# Patient Record
Sex: Female | Born: 1970 | Hispanic: No | Marital: Single | Smoking: Former smoker
Health system: Southern US, Community
[De-identification: ages and names within clinical notes are randomized; demographics above are authoritative.]

## PROBLEM LIST (undated history)

## (undated) DIAGNOSIS — D649 Anemia, unspecified: Secondary | ICD-10-CM

## (undated) DIAGNOSIS — F329 Major depressive disorder, single episode, unspecified: Secondary | ICD-10-CM

## (undated) DIAGNOSIS — F419 Anxiety disorder, unspecified: Secondary | ICD-10-CM

## (undated) DIAGNOSIS — F32A Depression, unspecified: Secondary | ICD-10-CM

## (undated) HISTORY — PX: OTHER SURGICAL HISTORY: SHX169

## (undated) HISTORY — PX: APPENDECTOMY: SHX54

---

## 1998-03-02 ENCOUNTER — Emergency Department (HOSPITAL_COMMUNITY): Admission: EM | Admit: 1998-03-02 | Discharge: 1998-03-02 | Payer: Self-pay | Admitting: Family Medicine

## 1999-02-03 ENCOUNTER — Ambulatory Visit (HOSPITAL_COMMUNITY): Admission: RE | Admit: 1999-02-03 | Discharge: 1999-02-03 | Payer: Self-pay | Admitting: Obstetrics

## 1999-03-07 ENCOUNTER — Ambulatory Visit (HOSPITAL_COMMUNITY): Admission: RE | Admit: 1999-03-07 | Discharge: 1999-03-07 | Payer: Self-pay | Admitting: *Deleted

## 1999-04-27 ENCOUNTER — Inpatient Hospital Stay (HOSPITAL_COMMUNITY): Admission: AD | Admit: 1999-04-27 | Discharge: 1999-04-27 | Payer: Self-pay | Admitting: *Deleted

## 1999-05-20 ENCOUNTER — Ambulatory Visit (HOSPITAL_COMMUNITY): Admission: RE | Admit: 1999-05-20 | Discharge: 1999-05-20 | Payer: Self-pay | Admitting: *Deleted

## 1999-05-26 ENCOUNTER — Inpatient Hospital Stay (HOSPITAL_COMMUNITY): Admission: AD | Admit: 1999-05-26 | Discharge: 1999-05-26 | Payer: Self-pay | Admitting: *Deleted

## 1999-06-03 ENCOUNTER — Inpatient Hospital Stay (HOSPITAL_COMMUNITY): Admission: AD | Admit: 1999-06-03 | Discharge: 1999-06-03 | Payer: Self-pay | Admitting: Obstetrics & Gynecology

## 1999-06-14 ENCOUNTER — Inpatient Hospital Stay (HOSPITAL_COMMUNITY): Admission: AD | Admit: 1999-06-14 | Discharge: 1999-06-14 | Payer: Self-pay | Admitting: *Deleted

## 1999-06-18 ENCOUNTER — Inpatient Hospital Stay (HOSPITAL_COMMUNITY): Admission: AD | Admit: 1999-06-18 | Discharge: 1999-06-18 | Payer: Self-pay | Admitting: Obstetrics & Gynecology

## 1999-06-19 ENCOUNTER — Observation Stay (HOSPITAL_COMMUNITY): Admission: AD | Admit: 1999-06-19 | Discharge: 1999-06-20 | Payer: Self-pay | Admitting: Obstetrics

## 1999-06-23 ENCOUNTER — Inpatient Hospital Stay (HOSPITAL_COMMUNITY): Admission: AD | Admit: 1999-06-23 | Discharge: 1999-06-26 | Payer: Self-pay | Admitting: Obstetrics & Gynecology

## 2000-08-07 ENCOUNTER — Encounter: Admission: RE | Admit: 2000-08-07 | Discharge: 2000-08-07 | Payer: Self-pay | Admitting: Family Medicine

## 2000-08-09 ENCOUNTER — Ambulatory Visit (HOSPITAL_COMMUNITY): Admission: RE | Admit: 2000-08-09 | Discharge: 2000-08-09 | Payer: Self-pay | Admitting: *Deleted

## 2001-07-08 ENCOUNTER — Encounter: Payer: Self-pay | Admitting: Internal Medicine

## 2001-07-08 ENCOUNTER — Encounter: Admission: RE | Admit: 2001-07-08 | Discharge: 2001-07-08 | Payer: Self-pay | Admitting: Internal Medicine

## 2002-02-03 ENCOUNTER — Encounter (INDEPENDENT_AMBULATORY_CARE_PROVIDER_SITE_OTHER): Payer: Self-pay | Admitting: Specialist

## 2002-02-04 ENCOUNTER — Inpatient Hospital Stay (HOSPITAL_COMMUNITY): Admission: AD | Admit: 2002-02-04 | Discharge: 2002-02-05 | Payer: Self-pay | Admitting: *Deleted

## 2005-03-13 ENCOUNTER — Ambulatory Visit: Payer: Self-pay | Admitting: Family Medicine

## 2005-03-13 ENCOUNTER — Other Ambulatory Visit: Admission: RE | Admit: 2005-03-13 | Discharge: 2005-03-13 | Payer: Self-pay | Admitting: Family Medicine

## 2005-03-13 ENCOUNTER — Encounter (INDEPENDENT_AMBULATORY_CARE_PROVIDER_SITE_OTHER): Payer: Self-pay | Admitting: Specialist

## 2005-03-14 ENCOUNTER — Other Ambulatory Visit: Admission: RE | Admit: 2005-03-14 | Discharge: 2005-03-14 | Payer: Self-pay | Admitting: Family Medicine

## 2007-05-16 ENCOUNTER — Telehealth (INDEPENDENT_AMBULATORY_CARE_PROVIDER_SITE_OTHER): Payer: Self-pay | Admitting: *Deleted

## 2007-05-20 ENCOUNTER — Encounter (INDEPENDENT_AMBULATORY_CARE_PROVIDER_SITE_OTHER): Payer: Self-pay | Admitting: Family Medicine

## 2007-05-20 ENCOUNTER — Other Ambulatory Visit: Admission: RE | Admit: 2007-05-20 | Discharge: 2007-05-20 | Payer: Self-pay | Admitting: Family Medicine

## 2007-05-20 ENCOUNTER — Ambulatory Visit: Payer: Self-pay | Admitting: Family Medicine

## 2007-05-20 DIAGNOSIS — N760 Acute vaginitis: Secondary | ICD-10-CM | POA: Insufficient documentation

## 2007-05-22 ENCOUNTER — Telehealth (INDEPENDENT_AMBULATORY_CARE_PROVIDER_SITE_OTHER): Payer: Self-pay | Admitting: Family Medicine

## 2007-05-24 ENCOUNTER — Encounter (INDEPENDENT_AMBULATORY_CARE_PROVIDER_SITE_OTHER): Payer: Self-pay | Admitting: *Deleted

## 2008-01-09 ENCOUNTER — Telehealth (INDEPENDENT_AMBULATORY_CARE_PROVIDER_SITE_OTHER): Payer: Self-pay | Admitting: *Deleted

## 2008-04-06 ENCOUNTER — Emergency Department (HOSPITAL_COMMUNITY): Admission: EM | Admit: 2008-04-06 | Discharge: 2008-04-06 | Payer: Self-pay | Admitting: Emergency Medicine

## 2008-09-06 ENCOUNTER — Emergency Department (HOSPITAL_COMMUNITY): Admission: EM | Admit: 2008-09-06 | Discharge: 2008-09-06 | Payer: Self-pay | Admitting: Emergency Medicine

## 2010-08-16 LAB — URINALYSIS, ROUTINE W REFLEX MICROSCOPIC
Bilirubin Urine: NEGATIVE
Glucose, UA: NEGATIVE mg/dL
Ketones, ur: NEGATIVE mg/dL
Nitrite: NEGATIVE
Protein, ur: NEGATIVE mg/dL
Specific Gravity, Urine: 1.005 (ref 1.005–1.030)
Urobilinogen, UA: 0.2 mg/dL (ref 0.0–1.0)
pH: 6.5 (ref 5.0–8.0)

## 2010-08-16 LAB — URINE CULTURE

## 2010-08-16 LAB — PREGNANCY, URINE: Preg Test, Ur: NEGATIVE

## 2010-08-16 LAB — URINE MICROSCOPIC-ADD ON

## 2010-09-23 NOTE — Op Note (Signed)
Megan Wall, Megan Wall                       ACCOUNT NO.:  [id number]   MEDICAL RECORD NO.:  [id number]                   PATIENT TYPE:  OIB   LOCATION:  2899                                 FACILITY:  [facility]   PHYSICIAN:  Vikki Ports, M.D.         DATE OF BIRTH:  [birth year]   DATE OF PROCEDURE:  02/03/2002  DATE OF DISCHARGE:                                 OPERATIVE REPORT   PREOPERATIVE DIAGNOSES:  Right thyroid nodule.   POSTOPERATIVE DIAGNOSES:  Right thyroid nodule.  Frozen section benign.   OPERATION PERFORMED:  Right thyroidectomy   SURGEON:  Vikki Ports, M.D.   ASSISTANT:  Rose Phi. Maple Hudson, M.D.   ANESTHESIA:  General.   DESCRIPTION OF PROCEDURE:  The patient was taken to the operating room and  placed in supine position.  After adequate  anesthesia was induced, using  endotracheal tube, the neck and upper chest was prepped and draped in the  usual sterile fashion.  Using a transverse low collar incision, I dissected  down through the platysma muscle.  Superior and inferior flaps were created  up to the cricoid notch and inferiorly to the sternal notch.  The strap  muscles were opened down the midline and the left thyroid lobe was digitally  palpated and found to be normal.  There was a very large right thyroid  nodule.  It was separated from overlying muscle and the superior pole was  dissected free with a right clamp in a medial to lateral direction.  The  superior pole was then ligated using a 2-0 silk ligature and a clip.  Then  the middle thyroid vein was identified, clipped and divided.  Dissection  rolling the thyroid nodule anteriorly was accomplished using both sharp and  blunt dissection.  Very careful dissection was taken near the ligament of  Berry until the right recurrent laryngeal nerve was identified and  preserved.  The thyroid was mobilized all the way to the midportion of the  trachea and was transected over straight hemostats.   This was oversewn with  3-0 Vicryl suture.  Adequate hemostasis was ensured.  The recurrent  laryngeal nerve was again inspected.  The frozen section showed the  follicular mostly likely benign process and therefore the strap muscles were  closed with a running 3-0 Vicryl sutures.  Platysma was closed with  interrupted 3-0 Vicryl.  Skin was closed with staples.  Sterile dressing was  applied.  The patient tolerated the procedure well and went to PACU in good  condition.                                                Vikki Ports, M.D.    KR[facility]/MEDQ  D:  02/03/2002  T:  02/03/2002  Job:  [id number]   cc:  Isla Pence, M.D. [facility]

## 2011-02-07 LAB — URINALYSIS, ROUTINE W REFLEX MICROSCOPIC
Bilirubin Urine: NEGATIVE
Ketones, ur: NEGATIVE
Nitrite: NEGATIVE
Specific Gravity, Urine: 1.018
Urobilinogen, UA: 0.2

## 2011-02-07 LAB — PREGNANCY, URINE: Preg Test, Ur: NEGATIVE

## 2011-02-07 LAB — URINE MICROSCOPIC-ADD ON

## 2012-07-25 ENCOUNTER — Emergency Department (HOSPITAL_COMMUNITY)
Admission: EM | Admit: 2012-07-25 | Discharge: 2012-07-26 | Disposition: A | Discharge status: Home / Self Care | Payer: Self-pay | Attending: Emergency Medicine | Admitting: Emergency Medicine

## 2012-07-25 ENCOUNTER — Encounter (HOSPITAL_COMMUNITY): Payer: Self-pay | Admitting: Emergency Medicine

## 2012-07-25 DIAGNOSIS — Z79899 Other long term (current) drug therapy: Secondary | ICD-10-CM | POA: Insufficient documentation

## 2012-07-25 DIAGNOSIS — F17200 Nicotine dependence, unspecified, uncomplicated: Secondary | ICD-10-CM | POA: Insufficient documentation

## 2012-07-25 DIAGNOSIS — Y93G9 Activity, other involving cooking and grilling: Secondary | ICD-10-CM | POA: Insufficient documentation

## 2012-07-25 DIAGNOSIS — S61209A Unspecified open wound of unspecified finger without damage to nail, initial encounter: Secondary | ICD-10-CM | POA: Insufficient documentation

## 2012-07-25 DIAGNOSIS — Y929 Unspecified place or not applicable: Secondary | ICD-10-CM | POA: Insufficient documentation

## 2012-07-25 DIAGNOSIS — Z23 Encounter for immunization: Secondary | ICD-10-CM | POA: Insufficient documentation

## 2012-07-25 DIAGNOSIS — W268XXA Contact with other sharp object(s), not elsewhere classified, initial encounter: Secondary | ICD-10-CM | POA: Insufficient documentation

## 2012-07-25 DIAGNOSIS — S61219A Laceration without foreign body of unspecified finger without damage to nail, initial encounter: Secondary | ICD-10-CM

## 2012-07-25 MED ORDER — TETANUS-DIPHTH-ACELL PERTUSSIS 5-2.5-18.5 LF-MCG/0.5 IM SUSP
0.5000 mL | Freq: Once | INTRAMUSCULAR | Status: AC
  Administered 2012-07-25: 0.5 mL via INTRAMUSCULAR
  Filled 2012-07-25: qty 0.5

## 2012-07-25 NOTE — ED Notes (Signed)
Pt states she cut her right index finger on a tin can while cooking supper tonight

## 2012-07-25 NOTE — ED Provider Notes (Signed)
History  This chart was scribed for non-physician practitioner Dierdre Forth, PA-C working with Celene Kras, MD, by Candelaria Stagers, ED Scribe. This patient was seen in room WTR9/WTR9 and the patient's care was started at 11:43 PM   CSN: [id number]  Arrival date & time 07/25/12  2217   First MD Initiated Contact with Patient 07/25/12 2342      Chief Complaint  Patient presents with  . finger laceration      The history is provided by the patient. No language interpreter was used.   Megan Wall is a 42 y.o. female who presents to the Emergency Department complaining of a laceration to the right index finger that she cut on a can about four hours ago while cooking.  The finger is wrapped.  Last tetanus is unknown.  Pain is rated at a 4/10, worsened with movement and palpation, rest makes it better. Laceration is on the pointer finger of the right hand.  History reviewed. No pertinent past medical history.  Past Surgical History  Procedure Laterality Date  . Goiter surgery      Family History  Problem Relation Age of Onset  . Diabetes Mother   . Diabetes Other   . Cancer Other     History  Substance Use Topics  . Smoking status: Current Some Day Smoker    Types: Cigarettes  . Smokeless tobacco: Not on file  . Alcohol Use: Yes     Comment: social    OB History   Grav Para Term Preterm Abortions TAB SAB Ect Mult Living                  Review of Systems  Skin: Positive for wound (laceration to right index finger).  All other systems reviewed and are negative.    Allergies  Review of patient's allergies indicates no known allergies.  Home Medications   Current Outpatient Rx  Name  Route  Sig  Dispense  Refill  . ferrous sulfate 325 (65 FE) MG tablet   Oral   Take 325 mg by mouth 3 (three) times a week.         Marland Kitchen GARLIC PO   Oral   Take 1 tablet by mouth every morning.         Marland Kitchen ibuprofen (ADVIL,MOTRIN) 200 MG tablet   Oral   Take 800 mg by  mouth every 6 (six) hours as needed for pain or headache.         . Multiple Vitamin (MULTIVITAMIN WITH MINERALS) TABS   Oral   Take 1 tablet by mouth every morning.         . niacin 500 MG tablet   Oral   Take 500 mg by mouth daily with breakfast.           BP 119/79  Pulse 72  Temp(Src) 97.7 F (36.5 C) (Oral)  Resp 16  SpO2 99%  LMP 07/07/2012  Physical Exam  Nursing note and vitals reviewed. Constitutional: She is oriented to person, place, and time. She appears well-developed and well-nourished. No distress.  HENT:  Head: Normocephalic and atraumatic.  Eyes: Conjunctivae are normal. No scleral icterus.  Neck: Normal range of motion.  Cardiovascular: Normal rate, regular rhythm and intact distal pulses.   Pulmonary/Chest: Effort normal and breath sounds normal. No respiratory distress.  Musculoskeletal: Normal range of motion. She exhibits no edema.  Neurological: She is alert and oriented to person, place, and time.  Skin: Skin is warm and  dry. She is not diaphoretic.  1 cm V-shape superficial laceration to the distal end of the right pointer finger.  Hemostasis has been achieved.    Psychiatric: She has a normal mood and affect. Her behavior is normal.    ED Course  LACERATION REPAIR Date/Time: 07/26/2012 12:35 AM Performed by: Dierdre Forth Authorized by: Dierdre Forth Consent: Verbal consent obtained. Risks and benefits: risks, benefits and alternatives were discussed Consent given by: patient Patient understanding: patient states understanding of the procedure being performed Patient consent: the patient's understanding of the procedure matches consent given Procedure consent: procedure consent matches procedure scheduled Relevant documents: relevant documents present and verified Site marked: the operative site was marked Required items: required blood products, implants, devices, and special equipment available Patient identity  confirmed: verbally with patient and arm band Time out: Immediately prior to procedure a "time out" was called to verify the correct patient, procedure, equipment, support staff and site/side marked as required. Body area: upper extremity Location details: right index finger Laceration length: 1 cm Foreign bodies: no foreign bodies Tendon involvement: none Nerve involvement: none Vascular damage: no Patient sedated: no Irrigation solution: saline Irrigation method: syringe Amount of cleaning: standard Debridement: none Degree of undermining: none Skin closure: glue Approximation: close Approximation difficulty: simple Dressing: 4x4 sterile gauze Patient tolerance: Patient tolerated the procedure well with no immediate complications.     DIAGNOSTIC STUDIES: Oxygen Saturation is 99% on room air, normal by my interpretation.    COORDINATION OF CARE:  11:45 PM Discussed course of care with pt which includes laceration repair and tetanus shot.  Pt understands and agrees.    Labs Reviewed - No data to display No results found.   1. Laceration of finger, right, initial encounter       MDM  Peggye Poon presents for laceration.  Tdap booster given.Pressure irrigation performed. Discussed at length the preference of suturing the laceration with the patient, but she is unwilling to endure the needles for numbing.  The laceration in superficial and well approximated.  I agreed to dermabond the laceration and gave strict f/u instructions and return precautions.  Laceration occurred < 8 hours prior to repair which was well tolerated. Pt has no co morbidities to effect normal wound healing. Discussed dermabond home care w pt and answered questions. Pt to f-u for wound check in 7 days. Pt is hemodynamically stable w no complaints prior to dc.     I personally performed the services described in this documentation, which was scribed in my presence. The recorded information has been  reviewed and is accurate.        Dahlia Client Daymian Lill, PA-C 07/26/12 0040

## 2012-07-29 NOTE — ED Provider Notes (Signed)
Medical screening examination/treatment/procedure(s) were performed by non-physician practitioner and as supervising physician I was immediately available for consultation/collaboration.    Abbie Berling R Cadie Sorci, MD 07/29/12 1026 

## 2012-12-17 ENCOUNTER — Encounter (HOSPITAL_COMMUNITY): Payer: Self-pay | Admitting: Emergency Medicine

## 2012-12-17 ENCOUNTER — Emergency Department (HOSPITAL_COMMUNITY)
Admission: EM | Admit: 2012-12-17 | Discharge: 2012-12-17 | Disposition: A | Discharge status: Home / Self Care | Payer: Medicaid Other | Attending: Emergency Medicine | Admitting: Emergency Medicine

## 2012-12-17 DIAGNOSIS — R630 Anorexia: Secondary | ICD-10-CM | POA: Insufficient documentation

## 2012-12-17 DIAGNOSIS — R000 Tachycardia, unspecified: Secondary | ICD-10-CM | POA: Insufficient documentation

## 2012-12-17 DIAGNOSIS — K5900 Constipation, unspecified: Secondary | ICD-10-CM | POA: Insufficient documentation

## 2012-12-17 DIAGNOSIS — R110 Nausea: Secondary | ICD-10-CM | POA: Insufficient documentation

## 2012-12-17 DIAGNOSIS — F17200 Nicotine dependence, unspecified, uncomplicated: Secondary | ICD-10-CM | POA: Insufficient documentation

## 2012-12-17 DIAGNOSIS — R109 Unspecified abdominal pain: Secondary | ICD-10-CM | POA: Insufficient documentation

## 2012-12-17 DIAGNOSIS — Z79899 Other long term (current) drug therapy: Secondary | ICD-10-CM | POA: Insufficient documentation

## 2012-12-17 DIAGNOSIS — Z9889 Other specified postprocedural states: Secondary | ICD-10-CM | POA: Insufficient documentation

## 2012-12-17 MED ORDER — IBUPROFEN 800 MG PO TABS
800.0000 mg | ORAL_TABLET | Freq: Once | ORAL | Status: AC
  Administered 2012-12-17: 800 mg via ORAL
  Filled 2012-12-17: qty 1

## 2012-12-17 MED ORDER — FLEET ENEMA 7-19 GM/118ML RE ENEM
1.0000 | ENEMA | Freq: Once | RECTAL | Status: AC
  Administered 2012-12-17: 1 via RECTAL
  Filled 2012-12-17 (×2): qty 1

## 2012-12-17 NOTE — ED Notes (Signed)
Pt was given fleets enema and states that she had partial relief.

## 2012-12-17 NOTE — ED Notes (Signed)
Pt presenting to ed with c/o constipation pt states positive abdominal pain pt states last normal bowel movement 2-3 weeks ago

## 2012-12-17 NOTE — ED Provider Notes (Signed)
CSN: [id number]     Arrival date & time 12/17/12  1734 History     First MD Initiated Contact with Patient 12/17/12 1739     Chief Complaint  Patient presents with  . Constipation   (Consider location/radiation/quality/duration/timing/severity/associated sxs/prior Treatment) HPI Comments: 42 year old female presents to the emergency department complaining of constipation over the past 3 weeks. Up until one day ago she has not had a bowel movement, yesterday she had a very small hard bowel movement followed by another one today. She's tried taking stool softeners without relief. She is beginning to develop lower abdominal pain described as pressure, rated 10 out of 10. She has not tried any alleviating factors for her pain. States she tends to get constipated every 2-3 months or if she eats "non-organic" food. Over the past week has been eating "non-organic food". She has not tried any laxatives or enemas. Admits to decreased appetite. No vomiting. Denies fever or chills.  Patient is a 42 y.o. female presenting with constipation. The history is provided by the patient.  Constipation Associated symptoms: abdominal pain and nausea   Associated symptoms: no fever and no vomiting     History reviewed. No pertinent past medical history. Past Surgical History  Procedure Laterality Date  . Goiter surgery     Family History  Problem Relation Age of Onset  . Diabetes Mother   . Diabetes Other   . Cancer Other    History  Substance Use Topics  . Smoking status: Current Some Day Smoker    Types: Cigarettes  . Smokeless tobacco: Not on file  . Alcohol Use: Yes     Comment: social   OB History   Grav Para Term Preterm Abortions TAB SAB Ect Mult Living                 Review of Systems  Constitutional: Positive for appetite change. Negative for fever and chills.  Respiratory: Negative for shortness of breath.   Cardiovascular: Negative for chest pain.  Gastrointestinal: Positive for  nausea, abdominal pain and constipation. Negative for vomiting and blood in stool.  All other systems reviewed and are negative.    Allergies  Review of patient's allergies indicates no known allergies.  Home Medications   Current Outpatient Rx  Name  Route  Sig  Dispense  Refill  . ferrous sulfate 325 (65 FE) MG tablet   Oral   Take 325 mg by mouth 3 (three) times a week.         Marland Kitchen GARLIC PO   Oral   Take 1 tablet by mouth every morning.         Marland Kitchen ibuprofen (ADVIL,MOTRIN) 200 MG tablet   Oral   Take 800 mg by mouth every 6 (six) hours as needed for pain or headache.         . Multiple Vitamin (MULTIVITAMIN WITH MINERALS) TABS   Oral   Take 1 tablet by mouth every morning.         . niacin 500 MG tablet   Oral   Take 500 mg by mouth daily with breakfast.          BP 111/42  Pulse 109  Temp(Src) 98.4 F (36.9 C) (Oral)  Resp 20  SpO2 100% Physical Exam  Nursing note and vitals reviewed. Constitutional: She is oriented to person, place, and time. She appears well-developed and well-nourished. No distress.  HENT:  Head: Normocephalic and atraumatic.  Mouth/Throat: Oropharynx is clear and moist.  Eyes: Conjunctivae and EOM are normal. Pupils are equal, round, and reactive to light. No scleral icterus.  Neck: Normal range of motion. Neck supple.  Cardiovascular: Regular rhythm, normal heart sounds and normal pulses.  Tachycardia present.   Pulmonary/Chest: Effort normal and breath sounds normal. No respiratory distress.  Abdominal: Soft. Normal appearance and bowel sounds are normal. She exhibits no distension and no mass. There is tenderness. There is no rigidity, no rebound and no guarding.    No peritoneal signs.  Musculoskeletal: Normal range of motion. She exhibits no edema.  Neurological: She is alert and oriented to person, place, and time.  Skin: Skin is warm and dry. She is not diaphoretic.  Psychiatric: She has a normal mood and affect. Her  behavior is normal.    ED Course   Procedures (including critical care time)  Labs Reviewed - No data to display No results found. 1. Constipation     MDM  Patient with constipation x 3 weeks. Abdomen soft, ND, normal bowel sounds. No peritoneal signs. She appears in NAD, smiling during history. Generalized tenderness to palpation of lower abdomen. Will give enema and re-assess abdominal tenderness. 8:27 PM Patient had a BM with enema. Pain decreased to 5/10 from 10/10. Abdomen soft, mild tenderness, no peritoneal signs. Stable for discharge. Advised TOC laxatives, increased fluids and fiber. Return precautions discussed. F/u with PCP. Patient states understanding of treatment care plan and is agreeable.   Trevor Mace, PA-C 12/17/12 2028

## 2012-12-20 ENCOUNTER — Emergency Department (HOSPITAL_COMMUNITY): Payer: Medicaid Other

## 2012-12-20 ENCOUNTER — Emergency Department (HOSPITAL_COMMUNITY)
Admission: EM | Admit: 2012-12-20 | Discharge: 2012-12-20 | Disposition: A | Discharge status: Home / Self Care | Payer: Medicaid Other | Attending: Emergency Medicine | Admitting: Emergency Medicine

## 2012-12-20 ENCOUNTER — Encounter (HOSPITAL_COMMUNITY): Payer: Self-pay | Admitting: *Deleted

## 2012-12-20 DIAGNOSIS — R109 Unspecified abdominal pain: Secondary | ICD-10-CM

## 2012-12-20 DIAGNOSIS — F17200 Nicotine dependence, unspecified, uncomplicated: Secondary | ICD-10-CM | POA: Insufficient documentation

## 2012-12-20 DIAGNOSIS — Z3202 Encounter for pregnancy test, result negative: Secondary | ICD-10-CM | POA: Insufficient documentation

## 2012-12-20 DIAGNOSIS — D72829 Elevated white blood cell count, unspecified: Secondary | ICD-10-CM

## 2012-12-20 DIAGNOSIS — R630 Anorexia: Secondary | ICD-10-CM | POA: Insufficient documentation

## 2012-12-20 DIAGNOSIS — D259 Leiomyoma of uterus, unspecified: Secondary | ICD-10-CM | POA: Insufficient documentation

## 2012-12-20 DIAGNOSIS — R110 Nausea: Secondary | ICD-10-CM | POA: Insufficient documentation

## 2012-12-20 LAB — URINALYSIS, ROUTINE W REFLEX MICROSCOPIC
Bilirubin Urine: NEGATIVE
Glucose, UA: NEGATIVE mg/dL
Hgb urine dipstick: NEGATIVE
Ketones, ur: >80 mg/dL — AB
pH: 7.5 (ref 5.0–8.0)

## 2012-12-20 LAB — CBC WITH DIFFERENTIAL/PLATELET
Basophils Absolute: 0 10*3/uL (ref 0.0–0.1)
Basophils Relative: 0 % (ref 0–1)
Eosinophils Absolute: 0 10*3/uL (ref 0.0–0.7)
MCHC: 33.8 g/dL (ref 30.0–36.0)
Neutro Abs: 14.4 10*3/uL — ABNORMAL HIGH (ref 1.7–7.7)
Neutrophils Relative %: 85 % — ABNORMAL HIGH (ref 43–77)
RDW: 12.5 % (ref 11.5–15.5)

## 2012-12-20 LAB — BASIC METABOLIC PANEL
Chloride: 97 mEq/L (ref 96–112)
Creatinine, Ser: 0.72 mg/dL (ref 0.50–1.10)
GFR calc Af Amer: >90 mL/min (ref >90)
GFR calc non Af Amer: >90 mL/min (ref >90)
Potassium: 3.6 mEq/L (ref 3.5–5.1)

## 2012-12-20 LAB — URINE MICROSCOPIC-ADD ON

## 2012-12-20 LAB — WET PREP, GENITAL
Clue Cells Wet Prep HPF POC: NONE SEEN
Trich, Wet Prep: NONE SEEN

## 2012-12-20 MED ORDER — CEFTRIAXONE SODIUM 250 MG IJ SOLR
250.0000 mg | Freq: Once | INTRAMUSCULAR | Status: AC
  Administered 2012-12-20: 250 mg via INTRAMUSCULAR
  Filled 2012-12-20: qty 250

## 2012-12-20 MED ORDER — HYDROMORPHONE HCL PF 1 MG/ML IJ SOLN
1.0000 mg | Freq: Once | INTRAMUSCULAR | Status: AC
  Administered 2012-12-20: 1 mg via INTRAVENOUS
  Filled 2012-12-20: qty 1

## 2012-12-20 MED ORDER — SODIUM CHLORIDE 0.9 % IV BOLUS (SEPSIS)
1000.0000 mL | Freq: Once | INTRAVENOUS | Status: AC
  Administered 2012-12-20: 1000 mL via INTRAVENOUS

## 2012-12-20 MED ORDER — DOXYCYCLINE HYCLATE 100 MG PO TABS
100.0000 mg | ORAL_TABLET | Freq: Once | ORAL | Status: AC
  Administered 2012-12-20: 100 mg via ORAL
  Filled 2012-12-20: qty 1

## 2012-12-20 MED ORDER — OXYCODONE-ACETAMINOPHEN 5-325 MG PO TABS: 1.0000 | ORAL_TABLET | ORAL | Status: DC | PRN

## 2012-12-20 MED ORDER — IOHEXOL 300 MG/ML  SOLN
100.0000 mL | Freq: Once | INTRAMUSCULAR | Status: AC | PRN
  Administered 2012-12-20: 100 mL via INTRAVENOUS

## 2012-12-20 MED ORDER — PROMETHAZINE HCL 25 MG PO TABS: 25.0000 mg | ORAL_TABLET | Freq: Four times a day (QID) | ORAL | Status: DC | PRN

## 2012-12-20 MED ORDER — DOXYCYCLINE HYCLATE 100 MG PO CAPS: 100.0000 mg | ORAL_CAPSULE | Freq: Two times a day (BID) | ORAL | Status: DC

## 2012-12-20 MED ORDER — IOHEXOL 300 MG/ML  SOLN
50.0000 mL | Freq: Once | INTRAMUSCULAR | Status: AC | PRN
  Administered 2012-12-20: 50 mL via ORAL

## 2012-12-20 MED ORDER — OXYCODONE-ACETAMINOPHEN 5-325 MG PO TABS
2.0000 | ORAL_TABLET | Freq: Once | ORAL | Status: AC
  Administered 2012-12-20: 2 via ORAL
  Filled 2012-12-20: qty 2

## 2012-12-20 MED ORDER — METRONIDAZOLE 500 MG PO TABS: 500.0000 mg | ORAL_TABLET | Freq: Two times a day (BID) | ORAL | Status: DC

## 2012-12-20 MED ORDER — FENTANYL CITRATE 0.05 MG/ML IJ SOLN
100.0000 ug | Freq: Once | INTRAMUSCULAR | Status: AC
  Administered 2012-12-20: 100 ug via INTRAVENOUS
  Filled 2012-12-20: qty 2

## 2012-12-20 MED ORDER — SODIUM CHLORIDE 0.9 % IV SOLN
Freq: Once | INTRAVENOUS | Status: AC
  Administered 2012-12-20: 20 mL/h via INTRAVENOUS

## 2012-12-20 NOTE — ED Provider Notes (Signed)
Medical screening examination/treatment/procedure(s) were performed by non-physician practitioner and as supervising physician I was immediately available for consultation/collaboration.   Claudean Kinds, MD 12/20/12 [id number]

## 2012-12-20 NOTE — ED Provider Notes (Signed)
CSN: [id number]     Arrival date & time 12/25/2012  4098 History     First MD Initiated Contact with Patient 12-25-12 0541     Chief Complaint  Patient presents with  . Constipation   (Consider location/radiation/quality/duration/timing/severity/associated sxs/prior Treatment) HPI Is a 42 year old female with about a 3 week history of constipation. She was seen here 3 days ago and was given an enema and placed on laxatives. Despite using laxatives as instructed she states she still has not had a satisfying bowel movement. She is having pain in her perineal area on attempted defecation which she describes as severe. She denies pain with urination. She denies vaginal bleeding or discharge. She was nauseated yesterday evening and forced herself to vomit with some relief. Her abdomen is not distended.  History reviewed. No pertinent past medical history. Past Surgical History  Procedure Laterality Date  . Goiter surgery     Family History  Problem Relation Age of Onset  . Diabetes Mother   . Diabetes Other   . Cancer Other    History  Substance Use Topics  . Smoking status: Current Some Day Smoker -- 0.25 packs/day    Types: Cigarettes  . Smokeless tobacco: Not on file  . Alcohol Use: Yes     Comment: social   OB History   Grav Para Term Preterm Abortions TAB SAB Ect Mult Living   4 4 4             Review of Systems  All other systems reviewed and are negative.    Allergies  Review of patient's allergies indicates no known allergies.  Home Medications   No current outpatient prescriptions on file. BP 119/65  Pulse 88  Temp(Src) 99 F (37.2 C) (Oral)  Resp 17  Ht 5\' 7"  (1.702 m)  Wt 130 lb (58.968 kg)  BMI 20.36 kg/m2  SpO2 96%  LMP 12/13/2012  Physical Exam General: Well-developed, thin female in no acute distress; appearance consistent with age of record HENT: normocephalic, atraumatic Eyes: pupils equal round and reactive to light; extraocular muscles  intact Neck: supple Heart: regular rate and rhythm Lungs: clear to auscultation bilaterally Abdomen: soft; nondistended; suprapubic tenderness; no masses or hepatosplenomegaly; bowel sounds present GU: No vaginal bleeding; no vaginal discharge; right adnexal tenderness Rectal: Normal sphincter tone; no stool in vault; tenderness to right side of rectum Extremities: No deformity; full range of motion; pulses normal Neurologic: Awake, alert and oriented; motor function intact in all extremities and symmetric; no facial droop Skin: Warm and dry Psychiatric: Normal mood and affect    ED Course   Procedures (including critical care time)   MDM   Nursing notes and vitals signs, including pulse oximetry, reviewed.  Summary of this visit's results, reviewed by myself:  Labs:  No results found for this or any previous visit (from the past 24 hour(s)).  Imaging Studies: US Transvaginal Non-ob  Dec 25, 2012   *RADIOLOGY REPORT*  Clinical Data: Pelvic pain.  TRANSABDOMINAL AND TRANSVAGINAL ULTRASOUND OF PELVIS Technique:  Both transabdominal and transvaginal ultrasound examinations of the pelvis were performed. Transabdominal technique was performed for global imaging of the pelvis including uterus, ovaries, adnexal regions, and pelvic cul-de-sac.  It was necessary to proceed with endovaginal exam following the transabdominal exam to visualize the uterus, ovaries, and adnexa  .  Comparison:  Acute abdomen series of same date.  Findings:  Uterus: 9.7 x 6.8 x 7.8 cm.  Dominant masses is felt to arise from the posterior aspect of  the uterine fundus.  This measures 6.3 x 4.9 x 6.4 cm.  Heterogeneous with central hypoechogenicity. Displaces the endometrium anteriorly.  Endometrium: Normal, 8 mm.  Right ovary:  2.1 x 1.7 x 1.6 cm. Normal in morphology.  Left ovary: 2.4 x 1.9 x 2.8 cm. Normal in morphology.  Other findings: No free fluid  IMPRESSION:  1.  Dominant posterior uterine fundal mass which is most  consistent with a degenerative fibroid. 2.  Otherwise, normal pelvic ultrasound.   Original Report Authenticated By: Jeronimo Greaves, M.D.   US Pelvis Complete  12/20/2012   *RADIOLOGY REPORT*  Clinical Data: Pelvic pain.  TRANSABDOMINAL AND TRANSVAGINAL ULTRASOUND OF PELVIS Technique:  Both transabdominal and transvaginal ultrasound examinations of the pelvis were performed. Transabdominal technique was performed for global imaging of the pelvis including uterus, ovaries, adnexal regions, and pelvic cul-de-sac.  It was necessary to proceed with endovaginal exam following the transabdominal exam to visualize the uterus, ovaries, and adnexa  .  Comparison:  Acute abdomen series of same date.  Findings:  Uterus: 9.7 x 6.8 x 7.8 cm.  Dominant masses is felt to arise from the posterior aspect of the uterine fundus.  This measures 6.3 x 4.9 x 6.4 cm.  Heterogeneous with central hypoechogenicity. Displaces the endometrium anteriorly.  Endometrium: Normal, 8 mm.  Right ovary:  2.1 x 1.7 x 1.6 cm. Normal in morphology.  Left ovary: 2.4 x 1.9 x 2.8 cm. Normal in morphology.  Other findings: No free fluid  IMPRESSION:  1.  Dominant posterior uterine fundal mass which is most consistent with a degenerative fibroid. 2.  Otherwise, normal pelvic ultrasound.   Original Report Authenticated By: Jeronimo Greaves, M.D.   Ct Abdomen Pelvis W Contrast  12/20/2012   *RADIOLOGY REPORT*  Clinical Data: Constipation with no improvement with laxative/enemas.  Rectal region pain.  CT ABDOMEN AND PELVIS WITH CONTRAST  Technique:  Multidetector CT imaging of the abdomen and pelvis was performed following the standard protocol during bolus administration of intravenous contrast.  Contrast: OMNIPAQUE IOHEXOL 300 MG/ML  SOLN, 50mL OMNIPAQUE IOHEXOL 300 MG/ML  SOLN  Comparison: None.  Findings: There is no increased stool.  An air-fluid levels are noted throughout the colon.  There is no bowel wall thickening or inflammatory changes.  A cystic  appearing mass measuring 6.5 cm x 5.5 cm by 6.2 cm enlarges the posterior aspect of the upper uterine segment.  A sub centimeter low attenuation mass arises from the subserosal aspect of the anterior uterus.  The larger masses likely a degenerating fibroid.  It does cause some mass effect on the underlying distal sigmoid colon.  There is a small amount of pelvic free fluid.  Some mild fluid and fat stranding is seen adjacent to the cecal tip.  The appendix lies dislocation measuring 6 mm in greatest diameter, within normal range.  There are low density breast masses that are most likely cysts but should be correlated with mammography/breast ultrasound.  Clear lung bases.  The heart is normal in size.  Normal liver, spleen, gallbladder and pancreas.  No bile duct dilation.  The kidneys, ureters and bladder are unremarkable.  There are no pathologically enlarged lymph nodes.  No bony abnormality.  IMPRESSION: 6.5 cm degenerating fibroid enlarges the posterior upper uterine segment creating mild mass effect on the underlying distal sigmoid colon.  There is a small amount of associated free fluid in the pelvis.  This degenerating fibroid may be symptomatic and the cause of this patient's symptoms.  There is no constipation.  The colon shows fluid levels with hot wall thickening or inflammatory changes or evidence of obstruction.  The appendix is top normal in size with some adjacent inflammatory change in the fat in the cecal tip.  This fat stranding may be related to the fluid in the pelvis and the degenerating fibroid. Appendicitis should only be considered if there are clinical findings suspicious for this.  Low density breast masses are likely cysts.  Please correlate with mammography/breast ultrasound on a non emergent basis.   Original Report Authenticated By: Amie Portland, M.D.   Dg Abd Acute W/chest  12/20/2012   *RADIOLOGY REPORT*  Clinical Data: Mid and lower abdominal pain.  Nausea and vomiting.  Constipation.  ACUTE ABDOMEN SERIES (ABDOMEN 2 VIEW & CHEST 1 VIEW)  Comparison: No priors.  Findings: Lung volumes are normal.  No consolidative airspace disease.  No pleural effusions.  No pneumothorax.  No pulmonary nodule or mass noted.  Pulmonary vasculature and the cardiomediastinal silhouette are within normal limits.  Gas and stool are seen scattered throughout the colon extending to the level of the distal rectum.  No pathologic distension of small bowel is noted.  No gross evidence of pneumoperitoneum.  IMPRESSION: 1.  Nonobstructive bowel gas pattern. 2.  No pneumoperitoneum. 3.  No radiographic evidence of acute cardiopulmonary disease.   Original Report Authenticated By: Trudie Reed, M.D.    7:10 AM The patient's abdominal films and examination are not consistent with constipation. It is likely that the patient's dissatisfaction with his elimination is due to perineal pain. It is unclear if this represents a perirectal process or right adnexal process. We will start with a pelvic ultrasound but a CT may be required if this is nondiagnostic.  7:20 AM Dr. Hyacinth Meeker will follow up on results and make disposition.    Hanley Seamen, MD 12/21/12 [phone]

## 2012-12-20 NOTE — ED Provider Notes (Signed)
Patient reevaluated several times. I discussed her care with the Gen. surgery team who states that they have seen her and do not think that this is appendicitis. I have also discussed her care with the gynecologist states that she was patient on Monday and recommends treatment for possible pelvic inflammatory disease. The patient states that she has not had any sexual contact with them in over 10 years and it has been over 6 months and she has had sexual contact with a woman. He denies any history of gynecologic infections and has had no vaginal discharge or pain until the last 3 days.  She is non-peritoneal and my exam, she does have leukocytosis and I agree with the treatment prophylactic PID. She is not febrile, her tachycardia has completely resolved and she appears well and is amenable to discharge.    She will be seen on Monday morning by the gynecologist.  Dr. Burnice Logan   Meds given in ED:  Medications  0.9 %  sodium chloride infusion (20 mL/hr Intravenous New Bag/Given 12/20/12 0716)  fentaNYL (SUBLIMAZE) injection 100 mcg (100 mcg Intravenous Given 12/20/12 0715)  sodium chloride 0.9 % bolus 1,000 mL (0 mL Intravenous Stopped 12/20/12 0848)  sodium chloride 0.9 % bolus 1,000 mL (0 mL Intravenous Stopped 12/20/12 0848)  HYDROmorphone (DILAUDID) injection 1 mg (1 mg Intravenous Given 12/20/12 0913)  iohexol (OMNIPAQUE) 300 MG/ML solution 50 mL (50 mL Oral Contrast Given 12/20/12 0932)  iohexol (OMNIPAQUE) 300 MG/ML solution 100 mL (100 mL Intravenous Contrast Given 12/20/12 0959)  oxyCODONE-acetaminophen (PERCOCET/ROXICET) 5-325 MG per tablet 2 tablet (2 tablets Oral Given 12/20/12 1313)  cefTRIAXone (ROCEPHIN) injection 250 mg (250 mg Intramuscular Given 12/20/12 1316)  doxycycline (VIBRA-TABS) tablet 100 mg (100 mg Oral Given 12/20/12 1313)    New Prescriptions   DOXYCYCLINE (VIBRAMYCIN) 100 MG CAPSULE    Take 1 capsule (100 mg total) by mouth 2 (two) times daily.   METRONIDAZOLE (FLAGYL) 500  MG TABLET    Take 1 tablet (500 mg total) by mouth 2 (two) times daily.   OXYCODONE-ACETAMINOPHEN (PERCOCET) 5-325 MG PER TABLET    Take 1 tablet by mouth every 4 (four) hours as needed for pain.   PROMETHAZINE (PHENERGAN) 25 MG TABLET    Take 1 tablet (25 mg total) by mouth every 6 (six) hours as needed for nausea.      Vida Roller, MD 12/20/12 [phone]

## 2012-12-20 NOTE — ED Notes (Signed)
Pt states seen Tuesday for c/o constipation; states has tried everything she was told to do without any results; states has tried mineral oil/enemas/etc with small results; rectal pain

## 2012-12-20 NOTE — ED Notes (Signed)
Started vomiting tonight around 2100; c/o "personal area" pain and lower back and abdominal pain

## 2012-12-20 NOTE — Consult Note (Signed)
Reason for Consult:abdominal pain, CT finding concerned for appendicitis  Referring Physician: Dr. Hyacinth Meeker  HPI: Megan Wall is a healthy 42 year old female who presented to [facility] this morning with lower abdominal, back and perineal pain.  Duration of symptoms is 5 days.  Onset is unknown.  Coarse is unchanged.  Moderate in severity.  Associated with constipation.  She was seen in the ED on the 12th with 3 week history of constipation, given an enema, symptoms improved and she was discharged home.  She has tried several home remedies and laxatives.  She has continued to have bowel movements, however, have been small and unsatisfactory to her standards.  She also complains of nausea and anorexia.  She has an episode of emesis last night.  She has not eaten since last night.  Denies fever or chills.  Denies history of UC, crohns.  Denies recent travel.  LMP 12/13/12.  Denies vaginal discharge.  She is not sexually active.  Denies dysuria.    History reviewed. No pertinent past medical history.  Past Surgical History  Procedure Laterality Date  . Goiter surgery      Family History  Problem Relation Age of Onset  . Diabetes Mother   . Diabetes Other   . Cancer Other     Social History:  reports that she has been smoking Cigarettes.  She has been smoking about 0.00 packs per day. She does not have any smokeless tobacco history on file. She reports that  drinks alcohol. She reports that she does not use illicit drugs.  Allergies: No Known Allergies  Medications: {medication reviewed  Results for orders placed during the hospital encounter of 12/20/12 (from the past 48 hour(s))  WET PREP, GENITAL     Status: Abnormal   Collection Time    12/20/12  6:36 AM      Result Value Range   Yeast Wet Prep HPF POC NONE SEEN  NONE SEEN   Trich, Wet Prep NONE SEEN  NONE SEEN   Clue Cells Wet Prep HPF POC NONE SEEN  NONE SEEN   WBC, Wet Prep HPF POC FEW (*) NONE SEEN  URINALYSIS, ROUTINE W REFLEX  MICROSCOPIC     Status: Abnormal   Collection Time    12/20/12  6:49 AM      Result Value Range   Color, Urine YELLOW  YELLOW   APPearance CLEAR  CLEAR   Specific Gravity, Urine 1.034 (*) 1.005 - 1.030   pH 7.5  5.0 - 8.0   Glucose, UA NEGATIVE  NEGATIVE mg/dL   Hgb urine dipstick NEGATIVE  NEGATIVE   Bilirubin Urine NEGATIVE  NEGATIVE   Ketones, ur >80 (*) NEGATIVE mg/dL   Protein, ur 30 (*) NEGATIVE mg/dL   Urobilinogen, UA 0.2  0.0 - 1.0 mg/dL   Nitrite NEGATIVE  NEGATIVE   Leukocytes, UA NEGATIVE  NEGATIVE  PREGNANCY, URINE     Status: None   Collection Time    12/20/12  6:49 AM      Result Value Range   Preg Test, Ur NEGATIVE  NEGATIVE   Comment:            THE SENSITIVITY OF THIS     METHODOLOGY IS >20 mIU/mL.  URINE MICROSCOPIC-ADD ON     Status: None   Collection Time    12/20/12  6:49 AM      Result Value Range   Squamous Epithelial / LPF RARE  RARE   Urine-Other MUCOUS PRESENT  CBC WITH DIFFERENTIAL     Status: Abnormal   Collection Time    12/20/12  7:00 AM      Result Value Range   WBC 16.9 (*) 4.0 - 10.5 K/uL   RBC 4.67  3.87 - 5.11 MIL/uL   Hemoglobin 13.5  12.0 - 15.0 g/dL   HCT 40.3  47.4 - 25.9 %   MCV 85.4  78.0 - 100.0 fL   MCH 28.9  26.0 - 34.0 pg   MCHC 33.8  30.0 - 36.0 g/dL   RDW 56.3  87.5 - 64.3 %   Platelets 288  150 - 400 K/uL   Neutrophils Relative % 85 (*) 43 - 77 %   Neutro Abs 14.4 (*) 1.7 - 7.7 K/uL   Lymphocytes Relative 8 (*) 12 - 46 %   Lymphs Abs 1.4  0.7 - 4.0 K/uL   Monocytes Relative 7  3 - 12 %   Monocytes Absolute 1.1 (*) 0.1 - 1.0 K/uL   Eosinophils Relative 0  0 - 5 %   Eosinophils Absolute 0.0  0.0 - 0.7 K/uL   Basophils Relative 0  0 - 1 %   Basophils Absolute 0.0  0.0 - 0.1 K/uL  BASIC METABOLIC PANEL     Status: Abnormal   Collection Time    12/20/12  7:00 AM      Result Value Range   Sodium 133 (*) 135 - 145 mEq/L   Potassium 3.6  3.5 - 5.1 mEq/L   Chloride 97  96 - 112 mEq/L   CO2 26  19 - 32 mEq/L    Glucose, Bld 100 (*) 70 - 99 mg/dL   BUN 9  6 - 23 mg/dL   Creatinine, Ser 3.29  0.50 - 1.10 mg/dL   Calcium 8.9  8.4 - 51.8 mg/dL   GFR calc non Af Amer >90  >90 mL/min   GFR calc Af Amer >90  >90 mL/min   Comment: (NOTE)     The eGFR has been calculated using the CKD EPI equation.     This calculation has not been validated in all clinical situations.     eGFR's persistently <90 mL/min signify possible Chronic Kidney     Disease.    US Transvaginal Non-ob  12/20/2012   *RADIOLOGY REPORT*  Clinical Data: Pelvic pain.  TRANSABDOMINAL AND TRANSVAGINAL ULTRASOUND OF PELVIS Technique:  Both transabdominal and transvaginal ultrasound examinations of the pelvis were performed. Transabdominal technique was performed for global imaging of the pelvis including uterus, ovaries, adnexal regions, and pelvic cul-de-sac.  It was necessary to proceed with endovaginal exam following the transabdominal exam to visualize the uterus, ovaries, and adnexa  .  Comparison:  Acute abdomen series of same date.  Findings:  Uterus: 9.7 x 6.8 x 7.8 cm.  Dominant masses is felt to arise from the posterior aspect of the uterine fundus.  This measures 6.3 x 4.9 x 6.4 cm.  Heterogeneous with central hypoechogenicity. Displaces the endometrium anteriorly.  Endometrium: Normal, 8 mm.  Right ovary:  2.1 x 1.7 x 1.6 cm. Normal in morphology.  Left ovary: 2.4 x 1.9 x 2.8 cm. Normal in morphology.  Other findings: No free fluid  IMPRESSION:  1.  Dominant posterior uterine fundal mass which is most consistent with a degenerative fibroid. 2.  Otherwise, normal pelvic ultrasound.   Original Report Authenticated By: Jeronimo Greaves, M.D.   US Pelvis Complete  12/20/2012   *RADIOLOGY REPORT*  Clinical Data: Pelvic pain.  TRANSABDOMINAL  AND TRANSVAGINAL ULTRASOUND OF PELVIS Technique:  Both transabdominal and transvaginal ultrasound examinations of the pelvis were performed. Transabdominal technique was performed for global imaging of the pelvis  including uterus, ovaries, adnexal regions, and pelvic cul-de-sac.  It was necessary to proceed with endovaginal exam following the transabdominal exam to visualize the uterus, ovaries, and adnexa  .  Comparison:  Acute abdomen series of same date.  Findings:  Uterus: 9.7 x 6.8 x 7.8 cm.  Dominant masses is felt to arise from the posterior aspect of the uterine fundus.  This measures 6.3 x 4.9 x 6.4 cm.  Heterogeneous with central hypoechogenicity. Displaces the endometrium anteriorly.  Endometrium: Normal, 8 mm.  Right ovary:  2.1 x 1.7 x 1.6 cm. Normal in morphology.  Left ovary: 2.4 x 1.9 x 2.8 cm. Normal in morphology.  Other findings: No free fluid  IMPRESSION:  1.  Dominant posterior uterine fundal mass which is most consistent with a degenerative fibroid. 2.  Otherwise, normal pelvic ultrasound.   Original Report Authenticated By: Jeronimo Greaves, M.D.   Ct Abdomen Pelvis W Contrast  12/20/2012   *RADIOLOGY REPORT*  Clinical Data: Constipation with no improvement with laxative/enemas.  Rectal region pain.  CT ABDOMEN AND PELVIS WITH CONTRAST  Technique:  Multidetector CT imaging of the abdomen and pelvis was performed following the standard protocol during bolus administration of intravenous contrast.  Contrast: OMNIPAQUE IOHEXOL 300 MG/ML  SOLN, 50mL OMNIPAQUE IOHEXOL 300 MG/ML  SOLN  Comparison: None.  Findings: There is no increased stool.  An air-fluid levels are noted throughout the colon.  There is no bowel wall thickening or inflammatory changes.  A cystic appearing mass measuring 6.5 cm x 5.5 cm by 6.2 cm enlarges the posterior aspect of the upper uterine segment.  A sub centimeter low attenuation mass arises from the subserosal aspect of the anterior uterus.  The larger masses likely a degenerating fibroid.  It does cause some mass effect on the underlying distal sigmoid colon.  There is a small amount of pelvic free fluid.  Some mild fluid and fat stranding is seen adjacent to the cecal tip.   The appendix lies dislocation measuring 6 mm in greatest diameter, within normal range.  There are low density breast masses that are most likely cysts but should be correlated with mammography/breast ultrasound.  Clear lung bases.  The heart is normal in size.  Normal liver, spleen, gallbladder and pancreas.  No bile duct dilation.  The kidneys, ureters and bladder are unremarkable.  There are no pathologically enlarged lymph nodes.  No bony abnormality.  IMPRESSION: 6.5 cm degenerating fibroid enlarges the posterior upper uterine segment creating mild mass effect on the underlying distal sigmoid colon.  There is a small amount of associated free fluid in the pelvis.  This degenerating fibroid may be symptomatic and the cause of this patient's symptoms.  There is no constipation.  The colon shows fluid levels with hot wall thickening or inflammatory changes or evidence of obstruction.  The appendix is top normal in size with some adjacent inflammatory change in the fat in the cecal tip.  This fat stranding may be related to the fluid in the pelvis and the degenerating fibroid. Appendicitis should only be considered if there are clinical findings suspicious for this.  Low density breast masses are likely cysts.  Please correlate with mammography/breast ultrasound on a non emergent basis.   Original Report Authenticated By: Amie Portland, M.D.   Dg Abd Acute W/chest  12/20/2012   *  RADIOLOGY REPORT*  Clinical Data: Mid and lower abdominal pain.  Nausea and vomiting. Constipation.  ACUTE ABDOMEN SERIES (ABDOMEN 2 VIEW & CHEST 1 VIEW)  Comparison: No priors.  Findings: Lung volumes are normal.  No consolidative airspace disease.  No pleural effusions.  No pneumothorax.  No pulmonary nodule or mass noted.  Pulmonary vasculature and the cardiomediastinal silhouette are within normal limits.  Gas and stool are seen scattered throughout the colon extending to the level of the distal rectum.  No pathologic distension of  small bowel is noted.  No gross evidence of pneumoperitoneum.  IMPRESSION: 1.  Nonobstructive bowel gas pattern. 2.  No pneumoperitoneum. 3.  No radiographic evidence of acute cardiopulmonary disease.   Original Report Authenticated By: Trudie Reed, M.D.    Review of Systems  Constitutional: Positive for malaise/fatigue. Negative for fever and chills.  Respiratory: Negative for cough and shortness of breath.   Cardiovascular: Negative for chest pain, palpitations and leg swelling.  Gastrointestinal: Positive for nausea, vomiting and abdominal pain. Negative for diarrhea, blood in stool and melena.  Genitourinary: Negative for dysuria, frequency and flank pain.  Neurological: Negative for dizziness, sensory change, speech change, loss of consciousness, weakness and headaches.   Blood pressure 117/64, pulse 98, temperature 99 F (37.2 C), temperature source Oral, resp. rate 16, height 5\' 7"  (1.702 m), weight 130 lb (58.968 kg), last menstrual period 12/13/2012, SpO2 97.00%. Physical Exam  Constitutional: She appears well-developed and well-nourished. No distress.  HENT:  Head: Normocephalic and atraumatic.  Mouth/Throat: No oropharyngeal exudate.  Cardiovascular: Normal rate, normal heart sounds and intact distal pulses.  Exam reveals no gallop and no friction rub.   No murmur heard. Respiratory: Effort normal and breath sounds normal. No respiratory distress. She has no wheezes. She has no rales. She exhibits no tenderness.  GI: Soft. Bowel sounds are normal. She exhibits no distension and no mass. There is no rebound and no guarding.  Small reducible umbilical hernia. Pt has diffuse pelvic tenderness on exam, negative rebound tenderness, rovsing and mcburneys  Lymphadenopathy:    She has no cervical adenopathy.  Skin: She is not diaphoretic.    Assessment/Plan: Abdominal pain: She has a UA and negative.  GC pending.  WBC 16.9.  On exam she has pelvic tenderness states the pain  radiates to the back and "private area." She does not have point tenderness. etiology is unclear, favor GYN etiology, but cannot say with certainly that it is not secondary to appendicitis.  Would recommend GYN evaluation.   Ashok Norris ANP-BC Pager [phone] 12/20/2012, 12:15 PM   I was in the operating room and this patient was discharged prior to me examining her.

## 2012-12-20 NOTE — Progress Notes (Signed)
[facility] provided patient with a list of primary care resources and a [facility] application.  °

## 2012-12-21 ENCOUNTER — Encounter (HOSPITAL_COMMUNITY): Payer: Self-pay | Admitting: Anesthesiology

## 2012-12-21 ENCOUNTER — Observation Stay (HOSPITAL_COMMUNITY)
Admission: AD | Admit: 2012-12-21 | Discharge: 2012-12-23 | Disposition: A | Discharge status: Home / Self Care | Payer: Medicaid Other | Source: Ambulatory Visit | Attending: Surgery | Admitting: Surgery

## 2012-12-21 ENCOUNTER — Encounter (HOSPITAL_COMMUNITY): Payer: Self-pay | Admitting: *Deleted

## 2012-12-21 ENCOUNTER — Observation Stay (HOSPITAL_COMMUNITY): Payer: Medicaid Other | Admitting: Anesthesiology

## 2012-12-21 ENCOUNTER — Encounter (HOSPITAL_COMMUNITY): Admission: AD | Disposition: A | Discharge status: Home / Self Care | Payer: Self-pay | Source: Ambulatory Visit

## 2012-12-21 DIAGNOSIS — R142 Eructation: Secondary | ICD-10-CM | POA: Insufficient documentation

## 2012-12-21 DIAGNOSIS — R109 Unspecified abdominal pain: Secondary | ICD-10-CM

## 2012-12-21 DIAGNOSIS — R110 Nausea: Secondary | ICD-10-CM | POA: Insufficient documentation

## 2012-12-21 DIAGNOSIS — M545 Low back pain, unspecified: Secondary | ICD-10-CM | POA: Insufficient documentation

## 2012-12-21 DIAGNOSIS — R509 Fever, unspecified: Secondary | ICD-10-CM | POA: Insufficient documentation

## 2012-12-21 DIAGNOSIS — K37 Unspecified appendicitis: Secondary | ICD-10-CM

## 2012-12-21 DIAGNOSIS — R141 Gas pain: Secondary | ICD-10-CM | POA: Insufficient documentation

## 2012-12-21 DIAGNOSIS — K5900 Constipation, unspecified: Secondary | ICD-10-CM | POA: Insufficient documentation

## 2012-12-21 DIAGNOSIS — R52 Pain, unspecified: Secondary | ICD-10-CM | POA: Insufficient documentation

## 2012-12-21 DIAGNOSIS — D72829 Elevated white blood cell count, unspecified: Secondary | ICD-10-CM

## 2012-12-21 DIAGNOSIS — D259 Leiomyoma of uterus, unspecified: Secondary | ICD-10-CM | POA: Insufficient documentation

## 2012-12-21 DIAGNOSIS — K389 Disease of appendix, unspecified: Secondary | ICD-10-CM

## 2012-12-21 DIAGNOSIS — D219 Benign neoplasm of connective and other soft tissue, unspecified: Secondary | ICD-10-CM

## 2012-12-21 DIAGNOSIS — R630 Anorexia: Secondary | ICD-10-CM | POA: Insufficient documentation

## 2012-12-21 DIAGNOSIS — Z79899 Other long term (current) drug therapy: Secondary | ICD-10-CM | POA: Insufficient documentation

## 2012-12-21 DIAGNOSIS — Z5987 Material hardship due to limited financial resources, not elsewhere classified: Secondary | ICD-10-CM | POA: Insufficient documentation

## 2012-12-21 DIAGNOSIS — R143 Flatulence: Secondary | ICD-10-CM | POA: Insufficient documentation

## 2012-12-21 DIAGNOSIS — N949 Unspecified condition associated with female genital organs and menstrual cycle: Secondary | ICD-10-CM | POA: Insufficient documentation

## 2012-12-21 DIAGNOSIS — N63 Unspecified lump in unspecified breast: Secondary | ICD-10-CM | POA: Insufficient documentation

## 2012-12-21 DIAGNOSIS — Z598 Other problems related to housing and economic circumstances: Secondary | ICD-10-CM | POA: Insufficient documentation

## 2012-12-21 HISTORY — PX: LAPAROSCOPIC APPENDECTOMY: SHX408

## 2012-12-21 LAB — CBC
HCT: 34.9 % — ABNORMAL LOW (ref 36.0–46.0)
Hemoglobin: 11.5 g/dL — ABNORMAL LOW (ref 12.0–15.0)
MCH: 28.1 pg (ref 26.0–34.0)
MCHC: 33 g/dL (ref 30.0–36.0)
RDW: 12.5 % (ref 11.5–15.5)

## 2012-12-21 LAB — URINE MICROSCOPIC-ADD ON

## 2012-12-21 LAB — URINALYSIS, ROUTINE W REFLEX MICROSCOPIC
Glucose, UA: NEGATIVE mg/dL
Ketones, ur: >80 mg/dL — AB
Leukocytes, UA: NEGATIVE
Nitrite: NEGATIVE
Protein, ur: NEGATIVE mg/dL
pH: 6.5 (ref 5.0–8.0)

## 2012-12-21 LAB — COMPREHENSIVE METABOLIC PANEL
Albumin: 3.1 g/dL — ABNORMAL LOW (ref 3.5–5.2)
BUN: 5 mg/dL — ABNORMAL LOW (ref 6–23)
Calcium: 8.1 mg/dL — ABNORMAL LOW (ref 8.4–10.5)
GFR calc Af Amer: >90 mL/min (ref >90)
Glucose, Bld: 85 mg/dL (ref 70–99)
Potassium: 3.8 mEq/L (ref 3.5–5.1)
Total Protein: 6.3 g/dL (ref 6.0–8.3)

## 2012-12-21 LAB — AMYLASE: Amylase: 40 U/L (ref 0–105)

## 2012-12-21 LAB — GC/CHLAMYDIA PROBE AMP
CT Probe RNA: NEGATIVE
GC Probe RNA: NEGATIVE

## 2012-12-21 SURGERY — APPENDECTOMY, LAPAROSCOPIC
Anesthesia: General | Site: Abdomen | Wound class: Clean Contaminated

## 2012-12-21 MED ORDER — KCL IN DEXTROSE-NACL 20-5-0.45 MEQ/L-%-% IV SOLN
INTRAVENOUS | Status: DC
  Administered 2012-12-21 – 2012-12-22 (×2): via INTRAVENOUS
  Filled 2012-12-21 (×5): qty 1000

## 2012-12-21 MED ORDER — KETOROLAC TROMETHAMINE 30 MG/ML IJ SOLN
15.0000 mg | Freq: Once | INTRAMUSCULAR | Status: AC | PRN
  Administered 2012-12-21: 30 mg via INTRAVENOUS

## 2012-12-21 MED ORDER — LACTATED RINGERS IV SOLN: INTRAVENOUS | Status: DC

## 2012-12-21 MED ORDER — NEOSTIGMINE METHYLSULFATE 1 MG/ML IJ SOLN
INTRAMUSCULAR | Status: DC | PRN
  Administered 2012-12-21: 3 mg via INTRAVENOUS

## 2012-12-21 MED ORDER — MEPERIDINE HCL 50 MG/ML IJ SOLN: 6.2500 mg | INTRAMUSCULAR | Status: DC | PRN

## 2012-12-21 MED ORDER — SODIUM CHLORIDE 0.9 % IV SOLN
2.0000 g | INTRAVENOUS | Status: DC | PRN
  Administered 2012-12-21: 2 g via INTRAVENOUS

## 2012-12-21 MED ORDER — ONDANSETRON HCL 4 MG PO TABS: 4.0000 mg | ORAL_TABLET | Freq: Four times a day (QID) | ORAL | Status: DC | PRN

## 2012-12-21 MED ORDER — BUPIVACAINE HCL (PF) 0.25 % IJ SOLN
INTRAMUSCULAR | Status: AC
  Filled 2012-12-21: qty 30

## 2012-12-21 MED ORDER — ONDANSETRON HCL 4 MG/2ML IJ SOLN: 4.0000 mg | Freq: Four times a day (QID) | INTRAMUSCULAR | Status: DC | PRN

## 2012-12-21 MED ORDER — KCL IN DEXTROSE-NACL 20-5-0.45 MEQ/L-%-% IV SOLN
INTRAVENOUS | Status: AC
  Filled 2012-12-21: qty 1000

## 2012-12-21 MED ORDER — GLYCOPYRROLATE 0.2 MG/ML IJ SOLN
INTRAMUSCULAR | Status: DC | PRN
  Administered 2012-12-21: .4 mg via INTRAVENOUS

## 2012-12-21 MED ORDER — HYDROMORPHONE HCL PF 1 MG/ML IJ SOLN
INTRAMUSCULAR | Status: AC
  Filled 2012-12-21: qty 1

## 2012-12-21 MED ORDER — ONDANSETRON 4 MG PO TBDP
4.0000 mg | ORAL_TABLET | Freq: Once | ORAL | Status: AC
  Administered 2012-12-21: 4 mg via ORAL
  Filled 2012-12-21: qty 1

## 2012-12-21 MED ORDER — PROPOFOL 10 MG/ML IV BOLUS
INTRAVENOUS | Status: DC | PRN
  Administered 2012-12-21: 110 mg via INTRAVENOUS

## 2012-12-21 MED ORDER — HYDROCODONE-ACETAMINOPHEN 5-325 MG PO TABS
1.0000 | ORAL_TABLET | ORAL | Status: DC | PRN
  Administered 2012-12-22 – 2012-12-23 (×5): 2 via ORAL
  Filled 2012-12-21 (×5): qty 2

## 2012-12-21 MED ORDER — SUCCINYLCHOLINE CHLORIDE 20 MG/ML IJ SOLN
INTRAMUSCULAR | Status: DC | PRN
  Administered 2012-12-21: 60 mg via INTRAVENOUS

## 2012-12-21 MED ORDER — MORPHINE SULFATE 2 MG/ML IJ SOLN
1.0000 mg | INTRAMUSCULAR | Status: DC | PRN
  Administered 2012-12-21 – 2012-12-22 (×3): 2 mg via INTRAVENOUS
  Filled 2012-12-21 (×3): qty 1

## 2012-12-21 MED ORDER — DEXTROSE 5 % IV SOLN
2.0000 g | Freq: Four times a day (QID) | INTRAVENOUS | Status: DC
  Administered 2012-12-21 – 2012-12-23 (×7): 2 g via INTRAVENOUS
  Filled 2012-12-21 (×8): qty 2

## 2012-12-21 MED ORDER — SODIUM CHLORIDE 0.9 % IV BOLUS (SEPSIS)
1000.0000 mL | Freq: Once | INTRAVENOUS | Status: AC
  Administered 2012-12-21: 1000 mL via INTRAVENOUS

## 2012-12-21 MED ORDER — ONDANSETRON HCL 4 MG/2ML IJ SOLN
INTRAMUSCULAR | Status: DC | PRN
  Administered 2012-12-21: 4 mg via INTRAVENOUS

## 2012-12-21 MED ORDER — MIDAZOLAM HCL 5 MG/5ML IJ SOLN
INTRAMUSCULAR | Status: DC | PRN
  Administered 2012-12-21: 2 mg via INTRAVENOUS

## 2012-12-21 MED ORDER — LACTATED RINGERS IV SOLN
INTRAVENOUS | Status: DC | PRN
  Administered 2012-12-21: 15:00:00 via INTRAVENOUS

## 2012-12-21 MED ORDER — IBUPROFEN 600 MG PO TABS
600.0000 mg | ORAL_TABLET | Freq: Four times a day (QID) | ORAL | Status: DC | PRN
  Filled 2012-12-21: qty 1

## 2012-12-21 MED ORDER — LIDOCAINE HCL (CARDIAC) 20 MG/ML IV SOLN
INTRAVENOUS | Status: DC | PRN
  Administered 2012-12-21: 60 mg via INTRAVENOUS

## 2012-12-21 MED ORDER — FENTANYL CITRATE 0.05 MG/ML IJ SOLN
INTRAMUSCULAR | Status: DC | PRN
  Administered 2012-12-21: 100 ug via INTRAVENOUS
  Administered 2012-12-21 (×2): 50 ug via INTRAVENOUS

## 2012-12-21 MED ORDER — KETOROLAC TROMETHAMINE 30 MG/ML IJ SOLN
INTRAMUSCULAR | Status: AC
  Filled 2012-12-21: qty 1

## 2012-12-21 MED ORDER — HYDROMORPHONE HCL PF 1 MG/ML IJ SOLN
1.0000 mg | Freq: Once | INTRAMUSCULAR | Status: AC
  Administered 2012-12-21: 1 mg via INTRAMUSCULAR
  Filled 2012-12-21: qty 1

## 2012-12-21 MED ORDER — CISATRACURIUM BESYLATE (PF) 10 MG/5ML IV SOLN
INTRAVENOUS | Status: DC | PRN
  Administered 2012-12-21: 5 mg via INTRAVENOUS

## 2012-12-21 MED ORDER — CEFOXITIN SODIUM-DEXTROSE 1-4 GM-% IV SOLR (PREMIX)
INTRAVENOUS | Status: AC
  Filled 2012-12-21: qty 100

## 2012-12-21 MED ORDER — BUPIVACAINE HCL (PF) 0.25 % IJ SOLN
INTRAMUSCULAR | Status: DC | PRN
  Administered 2012-12-21: 30 mL

## 2012-12-21 MED ORDER — HEPARIN SODIUM (PORCINE) 5000 UNIT/ML IJ SOLN
5000.0000 [IU] | Freq: Three times a day (TID) | INTRAMUSCULAR | Status: DC
  Administered 2012-12-21 – 2012-12-23 (×5): 5000 [IU] via SUBCUTANEOUS
  Filled 2012-12-21 (×8): qty 1

## 2012-12-21 MED ORDER — DEXAMETHASONE SODIUM PHOSPHATE 10 MG/ML IJ SOLN
INTRAMUSCULAR | Status: DC | PRN
  Administered 2012-12-21: 6 mg via INTRAVENOUS

## 2012-12-21 MED ORDER — PROMETHAZINE HCL 25 MG/ML IJ SOLN: 6.2500 mg | INTRAMUSCULAR | Status: DC | PRN

## 2012-12-21 MED ORDER — HYDROMORPHONE HCL PF 1 MG/ML IJ SOLN
0.2500 mg | INTRAMUSCULAR | Status: DC | PRN
  Administered 2012-12-21 (×2): 0.5 mg via INTRAVENOUS

## 2012-12-21 MED ORDER — SODIUM CHLORIDE 0.9 % IV SOLN
INTRAVENOUS | Status: DC | PRN
  Administered 2012-12-21: 15:00:00 via INTRAVENOUS

## 2012-12-21 SURGICAL SUPPLY — 41 items
ADH SKN CLS APL DERMABOND .7 (GAUZE/BANDAGES/DRESSINGS) ×1
APL SKNCLS STERI-STRIP NONHPOA (GAUZE/BANDAGES/DRESSINGS) ×1
APPLIER CLIP ROT 10 11.4 M/L (STAPLE)
APR CLP MED LRG 11.4X10 (STAPLE)
BAG SPEC RTRVL LRG 6X4 10 (ENDOMECHANICALS) ×1
BENZOIN TINCTURE PRP APPL 2/3 (GAUZE/BANDAGES/DRESSINGS) ×2 IMPLANT
CANISTER SUCTION 2500CC (MISCELLANEOUS) ×2 IMPLANT
CLIP APPLIE ROT 10 11.4 M/L (STAPLE) IMPLANT
CLOTH BEACON ORANGE TIMEOUT ST (SAFETY) ×2 IMPLANT
CUTTER FLEX LINEAR 45M (STAPLE) ×2 IMPLANT
DECANTER SPIKE VIAL GLASS SM (MISCELLANEOUS) ×2 IMPLANT
DERMABOND ADVANCED (GAUZE/BANDAGES/DRESSINGS) ×1
DERMABOND ADVANCED .7 DNX12 (GAUZE/BANDAGES/DRESSINGS) ×1 IMPLANT
DRAPE LAPAROSCOPIC ABDOMINAL (DRAPES) ×2 IMPLANT
ELECT REM PT RETURN 9FT ADLT (ELECTROSURGICAL) ×2
ELECTRODE REM PT RTRN 9FT ADLT (ELECTROSURGICAL) ×1 IMPLANT
ENDOLOOP SUT PDS II  0 18 (SUTURE)
ENDOLOOP SUT PDS II 0 18 (SUTURE) IMPLANT
GLOVE BIOGEL PI IND STRL 7.0 (GLOVE) ×1 IMPLANT
GLOVE BIOGEL PI INDICATOR 7.0 (GLOVE) ×1
GLOVE SURG SIGNA 7.5 PF LTX (GLOVE) ×2 IMPLANT
GOWN STRL NON-REIN LRG LVL3 (GOWN DISPOSABLE) ×2 IMPLANT
GOWN STRL REIN XL XLG (GOWN DISPOSABLE) ×4 IMPLANT
KIT BASIN OR (CUSTOM PROCEDURE TRAY) ×2 IMPLANT
PENCIL BUTTON HOLSTER BLD 10FT (ELECTRODE) IMPLANT
POUCH SPECIMEN RETRIEVAL 10MM (ENDOMECHANICALS) ×2 IMPLANT
RELOAD 45 VASCULAR/THIN (ENDOMECHANICALS) ×2 IMPLANT
RELOAD STAPLE TA45 3.5 REG BLU (ENDOMECHANICALS) IMPLANT
SCALPEL HARMONIC ACE (MISCELLANEOUS) ×2 IMPLANT
SET IRRIG TUBING LAPAROSCOPIC (IRRIGATION / IRRIGATOR) ×2 IMPLANT
SOLUTION ANTI FOG 6CC (MISCELLANEOUS) ×2 IMPLANT
STRIP CLOSURE SKIN 1/4X3 (GAUZE/BANDAGES/DRESSINGS) ×2 IMPLANT
SUT MON AB 5-0 PS2 18 (SUTURE) ×2 IMPLANT
TOWEL OR 17X26 10 PK STRL BLUE (TOWEL DISPOSABLE) ×2 IMPLANT
TOWEL OR NON WOVEN STRL DISP B (DISPOSABLE) ×2 IMPLANT
TRAY FOLEY CATH 14FRSI W/METER (CATHETERS) ×2 IMPLANT
TRAY LAP CHOLE (CUSTOM PROCEDURE TRAY) ×2 IMPLANT
TROCAR BLADELESS OPT 5 100 (ENDOMECHANICALS) ×2 IMPLANT
TROCAR XCEL BLUNT TIP 100MML (ENDOMECHANICALS) ×2 IMPLANT
TROCAR XCEL NON-BLD 11X100MML (ENDOMECHANICALS) ×2 IMPLANT
TUBING INSUFFLATION 10FT LAP (TUBING) ×2 IMPLANT

## 2012-12-21 NOTE — H&P (Addendum)
Re:   Megan Wall DOB:   09-10-1970 MRN:   [id number]  [facility]mission [facility]  ASSESSMENT AND PLAN: 1.  Possible appendicitis  I discussed with the patient the indications and risks of appendiceal surgery.  The primary risks of appendiceal surgery include, but are not limited to, bleeding, infection, bowel surgery, and open surgery.  There is also the risk that the patient may have continued symptoms after surgery.  The patient also understands the this may not be the source of her pain.  But it will remove the appendix as a possible source of her symptoms. We discussed the typical post-operative recovery course. I tried to answer the patient's questions.  The patient is being transferred from [facility] to [facility] for surgery.  I also spoke with the patient's mother, who is also a patient at [facility].  2. Uterine fibroid  I reviewed my findings with Dr. Debroah Loop.  He or one of his partners will look in during surgery.  Follow up would be through their office. [Dr. Tinnie Gens her for GYN] 3. Smokes, a little.  Chief Complaint  Patient presents with  . Pelvic Pain   REFERRING PHYSICIAN:  Dr. Marcie Bal  HISTORY OF PRESENT ILLNESS: Megan Wall is a 42 y.o. (DOB: Oct 02, 1970)  AA  female whose primary care physician is Loreen Freud, DO and comes to [facility] today for abdominal pain.  The pateint was seen in the [facility] 12/17/2012 and 12/20/2012 (yesterday) for abdominal pain.  She's had nausea, lower abdominal pain.  No change in bowel habits. There is some confusion by the notes that the patient left the ER prior to being seen by my partner, B. Layton.  Though she was seen by Ashok Norris, our PA. She's had several days of a lower abdominal pain.  She has no GI history or history of abdominal surgery.  This is an acute illness.  She still have regular periods.  She has not pain with her periods.  She has 4 children - ages: 80,18, 10, and 62  A CT scan yesterday saw a 6.5 cm x 6.2 cm.  Appendicitis was raised as a  possibility.  She presented today, 12/21/2012, to [facility] with continued abdominal pain.  She was seen by Dr. Marcie Bal who thought that her primary pain was not gynecologic.  He called me to evaluate the patient.  So this is her 3rd presentation to the ER in the last 5 days.   History reviewed. No pertinent past medical history.    Past Surgical History  Procedure Laterality Date  . Goiter surgery        Current Facility-Administered Medications  Medication Dose Route Frequency Provider Last Rate Last Dose  . sodium chloride 0.9 % bolus 1,000 mL  1,000 mL Intravenous Once Iona Hansen Rasch, NP   1,000 mL at 12/21/12 1256     No Known Allergies  REVIEW OF SYSTEMS: Skin:  No history of rash.  No history of abnormal moles. Infection:  No history of hepatitis or HIV.  No history of MRSA. Neurologic:  No history of stroke.  No history of seizure.  No history of headaches. Cardiac:  No history of hypertension. No history of heart disease.  No history of prior cardiac catheterization.  No history of seeing a cardiologist. Pulmonary:  Smokes 1 pack per month.  Endocrine:  No diabetes. Partial thyroidectomy, 2002. Gastrointestinal:  No history of stomach disease.  No history of liver disease.  No history of gall bladder  disease.  No history of pancreas disease.  No history of colon disease. Urologic:  No history of kidney stones.  No history of bladder infections. Musculoskeletal:  No history of joint or back disease. Hematologic:  No bleeding disorder.  No history of anemia.  Not anticoagulated. Psycho-social:  The patient is oriented.   Lesbian.  SOCIAL and FAMILY HISTORY: Unmarried. Lesbian Has children 21,18, 15, and 46. Works as a Horticulturist, commercial.  PHYSICAL EXAM: BP 113/57  Pulse 96  Temp(Src) 98.3 F (36.8 C) (Oral)  Resp 18  LMP 12/13/2012  General: Thin AA F who is alert.  HEENT: Normal. Pupils equal. Neck: Supple. No mass.  No thyroid  mass. Lymph Nodes:  No supraclavicular or cervical nodes. Lungs: Clear to auscultation and symmetric breath sounds. Heart:  RRR. No murmur or rub. Abdomen: Soft. No mass. Normal bowel sounds.  No abdominal scars. Small umbilical hernia.  Tender lower abdomen, right worse than left. GYN:  Allowed only one finger in her vagina, but has posterior uterine tenderness and tenderness on moving her uterus. Rectal: Not done. Extremities:  Good strength and ROM  in upper and lower extremities. Neurologic:  Grossly intact to motor and sensory function. Psychiatric: Has normal mood and affect. Behavior is normal.   DATA REVIEWED: CT scan and labs.  Ovidio Kin, MD,  [facility] [address]  [address]   [place], [place]    [address] Phone:  [phone] FAX:  [phone]

## 2012-12-21 NOTE — Transfer of Care (Signed)
Immediate Anesthesia Transfer of Care Note  Patient: Megan Wall  Procedure(s) Performed: Procedure(s): APPENDECTOMY LAPAROSCOPIC (N/A)  Patient Location: PACU  Anesthesia Type:General  Level of Consciousness: sedated  Airway & Oxygen Therapy: Patient Spontanous Breathing and Patient connected to face mask oxygen  Post-op Assessment: Report given to PACU RN and Post -op Vital signs reviewed and stable  Post vital signs: Reviewed and stable  Complications: No apparent anesthesia complications

## 2012-12-21 NOTE — Procedures (Signed)
Preoperative diagnosis: Pelvic pain, ? Appendicitis, fibroid uterus  Postoperative diagnosis: Same + degenerating fibroid  Procedure: Diagnostic laparoscopy  Surgeon: Tinnie Gens, MD  Anesthesia: GETT-Hatchett  Findings: Normal appearing  tubes, ovaries.  No evidence of endometriosis.  Green tinged fluid noted in the anterior and posterior cul-de-sacs. A large fibroid was noted posteriorly with a lot of vascularity.  Normal liver edge, no significant adhesive disease.  Estimated blood loss: Minimal  Complications: None known  Specimens: Peritoneal biopsies  Disposition of Specimen:  Pathology  Reason for procedure: 42 y.o. G4P4000 with h/o acute lower abdominal pain that has brought her to the ED x 3 this week. She was found to have a 6 cm fibroid that appeared to be degenerating.  Also noted to have some fat stranding, and ? Appendicitis.  General surgery brought her to the OR for appendectomy.  Please see their note for further details.   Procedure: Patient was taken to the operating room was placed in dorsal lithotomy in Fontanelle stirrups. She was prepped and draped in the usual sterile fashion. A timeout was performed. The patient received  Cefoxitin and SCDs were in place. Foley catheter is used to drain bladder. Speculum was placed inside the vagina. The cervix was visualized and grasped anteriorly with a single-tooth tenaculum. A Hulka tenaculum was placed through the cervix for uterine manipulation. The single tooth tenaculum and speculum were removed from the vagina.  Attention was then turned to the abdomen. The patient was then placed in Trendelenburg. The pelvis was inspected in a systematic fashion. The patient's right and left tubes and ovaries appeared normal. The posterior and anterior cul-de-sacs were inspected and felt to be normal. The ovarian fossa were inspected and also felt to be normal. The appendix was inspected and felt to be normal. It was removed by surgery. There was a  large uterine fibroid noted and small amount of free fluid, non-purulent in the pelvis. This was removed with the Nazhat. A small amount of saline was left in situ. The upper abdomen was inspected the liver edge gallbladder and stomach appeared normal. There were no additional findings in the pelvis and so the procedure was terminated. All instrument, needle  and lap counts were correct x 2. The patient was awakened to recovery in stable condition.  Demetrius Mahler SMD 12/21/2012 4:06 PM

## 2012-12-21 NOTE — MAU Note (Signed)
Pt. Was seen in the ER at [facility] and left yesterday. Initially went to the ER due to pain she has been having in her vagina/pelvis. At first she thought she was constipated. Did bring her prescriptions and is unable to fill these until Monday. Does not have a gyne. Was to follow up at the clinic Monday as well.

## 2012-12-21 NOTE — Progress Notes (Signed)
Patient ID: Megan Wall, female   DOB: Dec 20, 1970, 42 y.o.   MRN: [id number]  Pt. With acute abdominal pain.  Some nausea.  Pain is centralized and has been progressively getting worse.  Had pelvic sono and CT which indicated a degenerating fibroid.  There was some fat stranding noted on CT.  For that reason, general surgery will take out her appendix.  We will evaluate her pelvis.  She is not interested in hysterectomy at this time.

## 2012-12-21 NOTE — Progress Notes (Signed)
UptoDate information on degenerating fibroids  Leiomyoma degeneration or torsion - Infrequently, fibroids cause acute pain from degeneration (eg, carneous or red degeneration) or torsion of a pedunculated tumor. Pain may be associated with a low grade fever, uterine tenderness on palpation, elevated white blood cell count, or peritoneal signs. The discomfort resulting from degenerating fibroids is self-limited, lasting from days to a few weeks, and usually responds to nonsteroidal antiinflammatory drugs. Pelvic magnetic resonance imaging with gadolinium can be useful to make the diagnosis of degeneration since degenerating fibroids do not have enhancement following contrast administration. If acute pain is the sole indication for surgery, other disease processes, such as endometriosis and renal colic, or rare diagnoses such as pelvic tuberculosis, should be carefully excluded,

## 2012-12-21 NOTE — Preoperative (Signed)
Beta Blockers   Reason not to administer Beta Blockers:Not Applicable 

## 2012-12-21 NOTE — Anesthesia Procedure Notes (Signed)
Procedure Name: Intubation Date/Time: 12/21/2012 3:15 PM Performed by: Leroy Libman L Patient Re-evaluated:Patient Re-evaluated prior to inductionOxygen Delivery Method: Circle system utilized Preoxygenation: Pre-oxygenation with 100% oxygen Intubation Type: IV induction Ventilation: Mask ventilation without difficulty and Oral airway inserted - appropriate to patient size Laryngoscope Size: Hyacinth Meeker and 2 Grade View: Grade I Tube type: Oral Tube size: 7.0 mm Number of attempts: 1 Airway Equipment and Method: Stylet Placement Confirmation: ETT inserted through vocal cords under direct vision,  breath sounds checked- equal and bilateral and positive ETCO2 Secured at: 21 cm Tube secured with: Tape Dental Injury: Teeth and Oropharynx as per pre-operative assessment

## 2012-12-21 NOTE — Anesthesia Preprocedure Evaluation (Signed)
Anesthesia Evaluation  Patient identified by MRN, date of birth, ID band Patient awake    Reviewed: Allergy & Precautions, H&P , NPO status , Patient's Chart, lab work & pertinent test results  Airway Mallampati: II TM Distance: >3 FB Neck ROM: full    Dental no notable dental hx. (+) Teeth Intact   Pulmonary neg pulmonary ROS,    Pulmonary exam normal       Cardiovascular negative cardio ROS      Neuro/Psych negative neurological ROS  negative psych ROS   GI/Hepatic negative GI ROS, Neg liver ROS,   Endo/Other  negative endocrine ROS  Renal/GU negative Renal ROS     Musculoskeletal negative musculoskeletal ROS (+)   Abdominal Normal abdominal exam  (+)   Peds  Hematology negative hematology ROS (+)   Anesthesia Other Findings   Reproductive/Obstetrics negative OB ROS                           Anesthesia Physical Anesthesia Plan  ASA: II and emergent  Anesthesia Plan: General   Post-op Pain Management:    Induction: Intravenous  Airway Management Planned: Oral ETT  Additional Equipment:   Intra-op Plan:   Post-operative Plan: Extubation in OR  Informed Consent: I have reviewed the patients History and Physical, chart, labs and discussed the procedure including the risks, benefits and alternatives for the proposed anesthesia with the patient or authorized representative who has indicated his/her understanding and acceptance.   Dental Advisory Given  Plan Discussed with: CRNA and Surgeon  Anesthesia Plan Comments:         Anesthesia Quick Evaluation

## 2012-12-21 NOTE — Op Note (Addendum)
Re:   Cele Mote DOB:   1971/02/05 MRN:   [id number]                   FACILITY:  [facility]  DATE OF PROCEDURE: 12/21/2012                              OPERATIVE REPORT  PREOPERATIVE DIAGNOSIS:  Possible Appendicitis, posterior uterine fibroid  POSTOPERATIVE DIAGNOSIS:  Normal appearing appendix, degenerating posterior uterine fibroid  PROCEDURE:  Laparoscopic appendectomy.  Laparoscopic exploration.  SURGEON:  Sandria Bales. Ezzard Standing, MD  ASSISTANT:  Tinnie Gens, M.D.  ANESTHESIA:  General endotracheal.  Anesthesiologist: Sandrea Hughs., MD CRNA: Florene Route, CRNA  ASA:  2E  ESTIMATED BLOOD LOSS:  Minimal.  DRAINS: none   SPECIMEN:   Appendix  COUNTS CORRECT:  YES  INDICATIONS FOR PROCEDURE: Megan Wall is a 42 y.o. (DOB: 1971-01-01) AA  female whose primary care doctor is Loreen Freud, DO and comes to the OR for an appendectomy.  She has a somewhat unusual story in that on CT scan 12/20/2012 she has evidence of a 6 cm posterior uterine fibroid and evidence of possible early appendicitis.  She has been to [facility] twice in the last 4 days and to the [facility] ER today.  She was seen by Dr. Marcie Bal who thought her pain was not from the fibroid.  I think that she is best served with proceeding with appendectomy and exploration.  Dr. Gildardo Griffes is present during the operation for the GYN eval.  I discussed with the patient, the indications and potential complications of appendiceal surgery.  The potential complications include, but are not limited to, bleeding, open surgery, bowel resection, and the possibility of another diagnosis.  OPERATIVE NOTE:  The patient underwent a general endotracheal anesthetic as supervised by Anesthesiologist: Sandrea Hughs., MD CRNA: Florene Route, CRNA, General, in room #1 at [facility].  The patient was given 2 g of cefoxitin at the beginning of the procedure and the abdomen was prepped with ChloraPrep.  The patient had a foley catheter placed at  the beginning of the procedure.  She was in lithotomy and Dr. Shawnie Pons did a pelvic and placed a uterine tenaculum.  A time-out was held and surgical checklist run.  An infraumbilical incision was made with sharp dissection carried down to the abdominal cavity.  An 12 mm Hasson trocar was inserted through the infraumbilical incision and into the peritoneal cavity.  A 0 degree 10 mm laparoscope was inserted through a 5 mm Hasson trocar and the Hasson trocar secured with a 0 Vicryl suture.  I placed a 5 mm trocar in the right upper quadrant and 11 mm torcar in left lower quadrant and did abdominal exploration.    The right and left lobes of liver unremarkable.  Stomach was unremarkable.  I ran the last part of her ileum and saw no evidence of a Meckel's diverticula. Both her ovaries and tubes were okay.  But she had an enlarged uterus with a posterior mass.  There was neovascularization and a thin exudate posteriorly.  Photos were taken of the uterus.  She also had a little more thin greenish colored peritoneal fluid than is usually seen in the pelvis.  I then turn my attention to the appendix.  The patient's appendix looked normal.  But because of the concern of the appendix on the CT scan and the location of  there her pain, I though she would be best served by having her appendix removed.  The appendix located in the right lower quadrant just above the pelvic brim.  The mesentery of the appendix was divided with a Harmonic scalpel.  I got to the base of the appendix.  I then used a white load 45 mm Ethicon Endo-GIA stapler and fired this across the base of the appendix.  I placed the appendix in EndoCatch bag and delivered the bag through the umbilical incision.  I irrigated the abdomen with 300 cc of saline.  After irrigating the abdomen, I then removed the trocars, in turn.  The umbilical port fascia was closed with 0 Vicryl suture.   I closed the skin each site with a 5-0 Vicryl suture and painted the  wounds with Dermabond.  I then injected a total of 30 mL of 0.25% Marcaine at the incisions.  Sponge and needle count were correct at the end of the case.  The foley catheter was removed in the OR.  The patient was transferred to the recovery room in good condition.  The patient tolerated the procedure well and it depends on the patient's post op clinical course as to when she could be discharged.  She will need follow up with Dr. Tawni Levy office for the uterine fibroid.   Ovidio Kin, MD, [facility] Pager: [phone] Office phone:  [phone]

## 2012-12-21 NOTE — MAU Provider Note (Signed)
History     CSN: [id number]  Arrival date and time: 12/21/12 1105   First Provider Initiated Contact with Patient 12/21/12 1149      Chief Complaint  Patient presents with  . Pelvic Pain   HPI Ms.Azariya Youngis a 42 y.o.female; non pregnant, presents to MAU with complaints of vaginal pain, lower back pain, anorexia, nausea and bilateral lower abdominal pain. The symptoms started suddenly Monday and have progressed throughout the week. She was seen at [facility] yesterday and several other times with the same complaints. [place] and CT showed "fibroid tumor". She was given several presriptions in which she was unable to fill due to costs. She was referred to the [facility] clinic and is scheduled with an appointment on Monday. The pain became so severe that she needed to be seen again today; rates pain 10/10.   OB History   Grav Para Term Preterm Abortions TAB SAB Ect Mult Living   4 4 4              History reviewed. No pertinent past medical history.  Past Surgical History  Procedure Laterality Date  . Goiter surgery      Family History  Problem Relation Age of Onset  . Diabetes Mother   . Diabetes Other   . Cancer Other     History  Substance Use Topics  . Smoking status: Current Some Day Smoker -- 0.25 packs/day    Types: Cigarettes  . Smokeless tobacco: Not on file  . Alcohol Use: Yes     Comment: social    Allergies: No Known Allergies  Prescriptions prior to admission  Medication Sig Dispense Refill  . acetaminophen (TYLENOL) 500 MG tablet Take 1,000 mg by mouth every 6 (six) hours as needed for pain.      Marland Kitchen doxycycline (VIBRAMYCIN) 100 MG capsule Take 1 capsule (100 mg total) by mouth 2 (two) times daily.  20 capsule  0  . GARLIC PO Take 1 capsule by mouth daily.      Marland Kitchen ibuprofen (ADVIL,MOTRIN) 200 MG tablet Take 600 mg by mouth every 8 (eight) hours as needed for pain or headache.       . IRON PO Take 1 tablet by mouth daily.      . Multiple Vitamin (MULTIVITAMIN WITH  MINERALS) TABS Take 1 tablet by mouth every morning.      . niacin 500 MG tablet Take 500 mg by mouth daily with breakfast.      . promethazine (PHENERGAN) 25 MG tablet Take 25 mg by mouth every 6 (six) hours as needed for nausea.      . [DISCONTINUED] promethazine (PHENERGAN) 25 MG tablet Take 1 tablet (25 mg total) by mouth every 6 (six) hours as needed for nausea.  12 tablet  0  . metroNIDAZOLE (FLAGYL) 500 MG tablet Take 1 tablet (500 mg total) by mouth 2 (two) times daily.  20 tablet  0  . oxyCODONE-acetaminophen (PERCOCET) 5-325 MG per tablet Take 1 tablet by mouth every 4 (four) hours as needed for pain.  20 tablet  0   Results for orders placed during the hospital encounter of 12/21/12 (from the past 48 hour(s))  URINALYSIS, ROUTINE W REFLEX MICROSCOPIC     Status: Abnormal   Collection Time    12/21/12 11:50 AM      Result Value Range   Color, Urine YELLOW  YELLOW   APPearance CLEAR  CLEAR   Specific Gravity, Urine 1.025  1.005 - 1.030   pH  6.5  5.0 - 8.0   Glucose, UA NEGATIVE  NEGATIVE mg/dL   Hgb urine dipstick MODERATE (*) NEGATIVE   Bilirubin Urine NEGATIVE  NEGATIVE   Ketones, ur >80 (*) NEGATIVE mg/dL   Protein, ur NEGATIVE  NEGATIVE mg/dL   Urobilinogen, UA 0.2  0.0 - 1.0 mg/dL   Nitrite NEGATIVE  NEGATIVE   Leukocytes, UA NEGATIVE  NEGATIVE     Review of Systems  Constitutional: Positive for fever.  Cardiovascular: Negative for chest pain.  Gastrointestinal: Positive for nausea and abdominal pain. Negative for vomiting and constipation.  Genitourinary: Positive for flank pain.  Musculoskeletal: Positive for back pain.   Physical Exam   Blood pressure 113/57, pulse 96, temperature 98.3 F (36.8 C), temperature source Oral, resp. rate 18, last menstrual period 12/13/2012.  Physical Exam  Constitutional: She is oriented to person, place, and time. She appears well-developed and well-nourished.  GI: She exhibits distension. There is tenderness. There is  guarding.  Neurological: She is alert and oriented to person, place, and time.  Skin: Skin is warm.    MAU Course  Procedures  MDM 1 mg dilaudid IM 4 mg Zofran ODT UA Consulted with Dr. Debroah Loop at 1215 Dr. Debroah Loop in MAU at 1220  Assessment and Plan  I examined the patient and found RLQ tenderness and guarding. I discussed the case with Dr. Ezzard Standing and he will see the patient. Adam Phenix, MD    [facility], JENNIFER IRENE NP  12/21/2012, 11:49 AM

## 2012-12-21 NOTE — MAU Note (Signed)
Pt reports she has had pelvic pain on and off all week . Has been at [facility] several times this  Week. 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 Was instructed to f/u at the [facility] clinic on Monday but pain has gotten worse. Today.

## 2012-12-22 NOTE — Anesthesia Postprocedure Evaluation (Signed)
Anesthesia Post Note  Patient: Megan Wall  Procedure(s) Performed: Procedure(s) (LRB): APPENDECTOMY LAPAROSCOPIC (N/A)  Anesthesia type: General  Patient location: PACU  Post pain: Pain level controlled  Post assessment: Post-op Vital signs reviewed  Last Vitals:  Filed Vitals:   12/22/12 0515  BP: 90/61  Pulse: 57  Temp: 36.4 C  Resp: 16    Post vital signs: Reviewed  Level of consciousness: sedated  Complications: No apparent anesthesia complications

## 2012-12-22 NOTE — Progress Notes (Signed)
Utilization review completed.  

## 2012-12-22 NOTE — Progress Notes (Signed)
Patient ID: Megan Wall, female   DOB: 1971-02-02, 42 y.o.   MRN: [id number]  General Surgery - [facility] - Progress Note  POD# 1  Subjective: Patient with moderate pain - taking Percocet.  Some po intake.  Mother at bedside.  Wants to wait and go home tomorrow.  Objective: Vital signs in last 24 hours: Temp:  [97.6 F (36.4 C)-98.8 F (37.1 C)] 97.6 F (36.4 C) (08/17 0515) Pulse Rate:  [57-96] 57 (08/17 0515) Resp:  [15-24] 16 (08/17 0515) BP: (90-139)/(57-107) 90/61 mmHg (08/17 0515) SpO2:  [98 %-100 %] 99 % (08/17 0515) Weight:  [130 lb (58.968 kg)] 130 lb (58.968 kg) (08/17 0515)    Intake/Output from previous day: 08/16 0701 - 08/17 0700 In: 1100 [I.V.:1100] Out: 325 [Urine:300; Blood:25]  Exam: HEENT - clear, not icteric Neck - soft Chest - clear bilaterally Cor - RRR, no murmur Abd - soft, mild distension, mild tenderness; BS present; wounds clear and dry Ext - no significant edema Neuro - grossly intact, no focal deficits  Lab Results:   Recent Labs  12/20/12 0700 12/21/12 1251  WBC 16.9* 14.2*  HGB 13.5 11.5*  HCT 39.9 34.9*  PLT 288 286     Recent Labs  12/20/12 0700 12/21/12 1251  NA 133* 136  K 3.6 3.8  CL 97 101  CO2 26 24  GLUCOSE 100* 85  BUN 9 5*  CREATININE 0.72 0.62  CALCIUM 8.9 8.1*    Studies/Results: Ct Abdomen Pelvis W Contrast  12/20/2012   *RADIOLOGY REPORT*  Clinical Data: Constipation with no improvement with laxative/enemas.  Rectal region pain.  CT ABDOMEN AND PELVIS WITH CONTRAST  Technique:  Multidetector CT imaging of the abdomen and pelvis was performed following the standard protocol during bolus administration of intravenous contrast.  Contrast: OMNIPAQUE IOHEXOL 300 MG/ML  SOLN, 50mL OMNIPAQUE IOHEXOL 300 MG/ML  SOLN  Comparison: None.  Findings: There is no increased stool.  An air-fluid levels are noted throughout the colon.  There is no bowel wall thickening or inflammatory changes.  A  cystic appearing mass measuring 6.5 cm x 5.5 cm by 6.2 cm enlarges the posterior aspect of the upper uterine segment.  A sub centimeter low attenuation mass arises from the subserosal aspect of the anterior uterus.  The larger masses likely a degenerating fibroid.  It does cause some mass effect on the underlying distal sigmoid colon.  There is a small amount of pelvic free fluid.  Some mild fluid and fat stranding is seen adjacent to the cecal tip.  The appendix lies dislocation measuring 6 mm in greatest diameter, within normal range.  There are low density breast masses that are most likely cysts but should be correlated with mammography/breast ultrasound.  Clear lung bases.  The heart is normal in size.  Normal liver, spleen, gallbladder and pancreas.  No bile duct dilation.  The kidneys, ureters and bladder are unremarkable.  There are no pathologically enlarged lymph nodes.  No bony abnormality.  IMPRESSION: 6.5 cm degenerating fibroid enlarges the posterior upper uterine segment creating mild mass effect on the underlying distal sigmoid colon.  There is a small amount of associated free fluid in the pelvis.  This degenerating fibroid may be symptomatic and the cause of this patient's symptoms.  There is no constipation.  The colon shows fluid levels with hot wall thickening or inflammatory changes or evidence of obstruction.  The appendix is top normal in size with some adjacent inflammatory change in  the fat in the cecal tip.  This fat stranding may be related to the fluid in the pelvis and the degenerating fibroid. Appendicitis should only be considered if there are clinical findings suspicious for this.  Low density breast masses are likely cysts.  Please correlate with mammography/breast ultrasound on a non emergent basis.   Original Report Authenticated By: Amie Portland, M.D.    Assessment / Plan: 1.  Status post laparoscopy and lap appendectomy  Advance diet  PO pain Rx  Ambulate in halls  Will  arrange follow up with GYN clinic, Dr. Shawnie Pons  Likely home in AM 12/23/2012  Velora Heckler, MD, [facility] Office: [phone]  12/22/2012

## 2012-12-23 ENCOUNTER — Encounter (HOSPITAL_COMMUNITY): Payer: Self-pay | Admitting: Surgery

## 2012-12-23 ENCOUNTER — Ambulatory Visit (INDEPENDENT_AMBULATORY_CARE_PROVIDER_SITE_OTHER): Payer: Medicaid Other | Admitting: Obstetrics & Gynecology

## 2012-12-23 ENCOUNTER — Telehealth: Payer: Self-pay | Admitting: Obstetrics & Gynecology

## 2012-12-23 VITALS — BP 123/82 | HR 82 | Temp 97.2°F | Ht 67.0 in | Wt 140.6 lb

## 2012-12-23 DIAGNOSIS — R102 Pelvic and perineal pain: Secondary | ICD-10-CM

## 2012-12-23 DIAGNOSIS — N949 Unspecified condition associated with female genital organs and menstrual cycle: Secondary | ICD-10-CM

## 2012-12-23 DIAGNOSIS — D259 Leiomyoma of uterus, unspecified: Secondary | ICD-10-CM

## 2012-12-23 LAB — CBC
Hemoglobin: 10.6 g/dL — ABNORMAL LOW (ref 12.0–15.0)
MCHC: 32.7 g/dL (ref 30.0–36.0)
RBC: 3.85 MIL/uL — ABNORMAL LOW (ref 3.87–5.11)

## 2012-12-23 MED ORDER — OXYCODONE-ACETAMINOPHEN 5-325 MG PO TABS: 1.0000 | ORAL_TABLET | Freq: Four times a day (QID) | ORAL | Status: DC | PRN

## 2012-12-23 NOTE — Telephone Encounter (Signed)
Called the number in patient chart. Was told she would not see her today. Please call office.

## 2012-12-23 NOTE — Discharge Instructions (Signed)
[facility] ______[facility] °LAPAROSCOPIC SURGERY: POST OP INSTRUCTIONS °Always review your discharge instruction sheet given to you by the facility where your surgery was performed. °IF YOU HAVE DISABILITY OR FAMILY LEAVE FORMS, YOU MUST BRING THEM TO THE OFFICE FOR PROCESSING.   °DO NOT GIVE THEM TO YOUR DOCTOR. ° °1. A prescription for pain medication may be given to you upon discharge.  Take your pain medication as prescribed, if needed.  If narcotic pain medicine is not needed, then you may take acetaminophen (Tylenol) or ibuprofen (Advil) as needed. °2. Take your usually prescribed medications unless otherwise directed. °3. If you need a refill on your pain medication, please contact your pharmacy.  They will contact our office to request authorization. Prescriptions will not be filled after 5pm or on week-ends. °4. You should follow a light diet the first few days after arrival home, such as soup and crackers, etc.  Be sure to include lots of fluids daily. °5. Most patients will experience some swelling and bruising in the area of the incisions.  Ice packs will help.  Swelling and bruising can take several days to resolve.  °6. It is common to experience some constipation if taking pain medication after surgery.  Increasing fluid intake and taking a stool softener (such as Colace) will usually help or prevent this problem from occurring.  A mild laxative (Milk of Magnesia or Miralax) should be taken according to package instructions if there are no bowel movements after 48 hours. °7. Unless discharge instructions indicate otherwise, you may remove your bandages 24-48 hours after surgery, and you may shower at that time.  You may have steri-strips (small skin tapes) in place directly over the incision.  These strips should be left on the skin for 7-10 days.  If your surgeon used skin glue on the incision, you may shower in 24 hours.  The glue will flake off over the next 2-3 weeks.  Any sutures or  staples will be removed at the office during your follow-up visit. °8. ACTIVITIES:  You may resume regular (light) daily activities beginning the next day--such as daily self-care, walking, climbing stairs--gradually increasing activities as tolerated.  You may have sexual intercourse when it is comfortable.  Refrain from any heavy lifting or straining until approved by your doctor. °a. You may drive when you are no longer taking prescription pain medication, you can comfortably wear a seatbelt, and you can safely maneuver your car and apply brakes. °b. RETURN TO WORK:  __________________________________________________________ °9. You should see your doctor in the office for a follow-up appointment approximately 2-3 weeks after your surgery.  Make sure that you call for this appointment within a day or two after you arrive home to insure a convenient appointment time. °10. OTHER INSTRUCTIONS: __________________________________________________________________________________________________________________________ __________________________________________________________________________________________________________________________ °WHEN TO CALL YOUR DOCTOR: °1. Fever over 101.0 °2. Inability to urinate °3. Continued bleeding from incision. °4. Increased pain, redness, or drainage from the incision. °5. Increasing abdominal pain ° °The clinic staff is available to answer your questions during regular business hours.  Please don’t hesitate to call and ask to speak to one of the nurses for clinical concerns.  If you have a medical emergency, go to the nearest emergency room or call 911.  A surgeon from [facility] is always on call at the hospital. °[address], [address], [place], [place]  [address] ? [address] [address], [place], [place]   [address] °[phone] ? [phone] ? FAX [phone] °Web site:   [email or web address] °

## 2012-12-23 NOTE — Discharge Summary (Signed)
No general surgery issues.  Follow up With Dr Shawnie Pons.  Will have follow up with Dr Ezzard Standing in 2- 3 weeks.

## 2012-12-23 NOTE — Discharge Summary (Signed)
Physician Discharge Summary  Patient ID: Megan Wall MRN: [id number] DOB/AGE: Dec 26, 1970 42 y.o.  Admit date: 12/21/2012 Discharge date: 12/23/2012  Admitting Diagnosis: Abdominal pain Pelvic pain  Discharge Diagnosis Patient Active Problem List   Diagnosis Date Noted  . Acute abdominal pain 12/21/2012  . Fibroid 12/21/2012  . VAGINITIS 05/20/2007    Consultants Dr. Shawnie Pons, Ob/Gyn  Procedures 12/21/12: Diagnostic laparoscopy with appendectomy and peritoneal biopsy  Dr. Ezzard Standing  Dr. [place] Hospital Course:  42 yr old female who presented to [facility] with complaints of vaginal pain, lower back pain, anorexia, nausea and bilateral lower abdominal pain. The symptoms started suddenly Monday and had progressed throughout the week. She was seen at [facility] several times with the same complaints. [place] and CT showed "fibroid tumor". She was given several presriptions in which she was unable to fill due to costs. She was referred to the [facility] clinic and is scheduled with an appointment on Monday. The pain became so severe that she needed to be seen again today; rates pain 10/10.  A CT scan showed a 6.5 cm degenerating fibroid enlarges the posterior upper uterine segment creating mild mass effect on the underlying distal sigmoid  Colon with a small amount of associated free fluid in the pelvis and the appendix is top normal in size with some adjacent inflammatory change in the fat in the cecal tip. This fat stranding may be related to the fluid in the pelvis and the degenerating fibroid.  GYN did not feel her symptoms were GYN in nature and asked [place] to see about the appendix.  She was transferred to [facility] and underwent procedure above.  The appendix appeared normal during surgery.  Dr. Shawnie Pons took peritoneal biopsies during surgery.  Post-operative the patient did well overall.  Her diet was advanced and her pain was well controlled.  She was felt stable for discharge home.  She will follow up with Ob/Gyn in 2-3 weeks  to re-evaluate the large fibroid that was seen.    Physical Exam: VSS afebrile General: NAD Abd: mildly distended, +bs, incisions c/d/i, tender over incisions Ext: warm, well perfused     Medication List    STOP taking these medications       doxycycline 100 MG capsule  Commonly known as:  VIBRAMYCIN      TAKE these medications       acetaminophen 500 MG tablet  Commonly known as:  TYLENOL  Take 1,000 mg by mouth every 6 (six) hours as needed for pain.     GARLIC PO  Take 1 capsule by mouth daily.     ibuprofen 200 MG tablet  Commonly known as:  ADVIL,MOTRIN  Take 600 mg by mouth every 8 (eight) hours as needed for pain or headache.     IRON PO  Take 1 tablet by mouth daily.     metroNIDAZOLE 500 MG tablet  Commonly known as:  FLAGYL  Take 1 tablet (500 mg total) by mouth 2 (two) times daily.     multivitamin with minerals Tabs tablet  Take 1 tablet by mouth every morning.     niacin 500 MG tablet  Take 500 mg by mouth daily with breakfast.     oxyCODONE-acetaminophen 5-325 MG per tablet  Commonly known as:  PERCOCET  Take 1-2 tablets by mouth every 6 (six) hours as needed for pain.     promethazine 25 MG tablet  Commonly known as:  PHENERGAN  Take 25 mg by mouth every 6 (six) hours  as needed for nausea.             Follow-up Information   Follow up with PRATT,TANYA S, MD. Call in 3 weeks.   Specialty:  Obstetrics and Gynecology   Contact information:   [address] [place] [address] [phone]       Signed: Denny Levy [facility] [phone]  12/23/2012, 8:17 AM

## 2012-12-23 NOTE — Progress Notes (Signed)
Subjective:     Patient ID: Megan Wall, female   DOB: 1971-04-16, 42 y.o.   MRN: [id number]  HPI Pt is a 42 yo AA s/p SVD x 4.  Pt c/o pain beginning 1 week ago. Pain began as discomfort in the vagina.  Pain progressed and pt developed N/V.  Pt was seen in the ED 3-4 times last week due to the pain.  She was initially diagnosed with possible appendicitis and underwent a lap appy with intraop GYN eval.  Pt was also noted to have a degenerating post uterine fibroid. This appt was scheduled PRIOR to surgery and her hosp GYN consult.  She presents with a request for removal of the fibroid.  She reports pain but, cannot tell how this differs from her preop pain.  No past medical history on file.  Past Surgical History  Procedure Laterality Date  . Goiter surgery    . Laparoscopic appendectomy N/A 12/21/2012    Procedure: APPENDECTOMY LAPAROSCOPIC;  Surgeon: Kandis Cocking, MD;  Location: [facility];  Service: General;  Laterality: N/A;   Current Outpatient Prescriptions on File Prior to Visit  Medication Sig Dispense Refill  . acetaminophen (TYLENOL) 500 MG tablet Take 1,000 mg by mouth every 6 (six) hours as needed for pain.      Marland Kitchen GARLIC PO Take 1 capsule by mouth daily.      Marland Kitchen ibuprofen (ADVIL,MOTRIN) 200 MG tablet Take 600 mg by mouth every 8 (eight) hours as needed for pain or headache.       . IRON PO Take 1 tablet by mouth daily.      . metroNIDAZOLE (FLAGYL) 500 MG tablet Take 1 tablet (500 mg total) by mouth 2 (two) times daily.  20 tablet  0  . Multiple Vitamin (MULTIVITAMIN WITH MINERALS) TABS Take 1 tablet by mouth every morning.      . niacin 500 MG tablet Take 500 mg by mouth daily with breakfast.      . oxyCODONE-acetaminophen (PERCOCET) 5-325 MG per tablet Take 1-2 tablets by mouth every 6 (six) hours as needed for pain.  60 tablet  0  . promethazine (PHENERGAN) 25 MG tablet Take 25 mg by mouth every 6 (six) hours as needed for nausea.       No current facility-administered medications  on file prior to visit.   No Known Allergies  History   Social History  . Marital Status: Divorced    Spouse Name: N/A    Number of Children: N/A  . Years of Education: N/A   Occupational History  . Not on file.   Social History Main Topics  . Smoking status: Current Some Day Smoker -- 0.25 packs/day    Types: Cigarettes  . Smokeless tobacco: Not on file  . Alcohol Use: Yes     Comment: social  . Drug Use: No  . Sexual Activity: No   Other Topics Concern  . Not on file   Social History Narrative  . No narrative on file      Review of Systems     Objective:   Physical Exam BP 123/82  Pulse 82  Temp(Src) 97.2 F (36.2 C) (Oral)  Ht 5\' 7"  (1.702 m)  Wt 140 lb 9.6 oz (63.776 kg)  BMI 22.02 kg/m2  LMP 12/13/2012 Pt appears uncomfortable Pelvic Exam deferred as pt is recently post op from an appy     CBC    Component Value Date/Time   WBC 14.2* 12/21/2012 1251  RBC 4.09 12/21/2012 1251   HGB 11.5* 12/21/2012 1251   HCT 34.9* 12/21/2012 1251   PLT 286 12/21/2012 1251   MCV 85.3 12/21/2012 1251   MCH 28.1 12/21/2012 1251   MCHC 33.0 12/21/2012 1251   RDW 12.5 12/21/2012 1251   LYMPHSABS 1.4 12/20/2012 0700   MONOABS 1.1* 12/20/2012 0700   EOSABS 0.0 12/20/2012 0700   BASOSABS 0.0 12/20/2012 0700     12/20/2012 TRANSABDOMINAL AND TRANSVAGINAL ULTRASOUND OF PELVIS  Technique: Both transabdominal and transvaginal ultrasound  examinations of the pelvis were performed. Transabdominal technique  was performed for global imaging of the pelvis including uterus,  ovaries, adnexal regions, and pelvic cul-de-sac.  It was necessary to proceed with endovaginal exam following the  transabdominal exam to visualize the uterus, ovaries, and adnexa .  Comparison: Acute abdomen series of same date.  Findings:  Uterus: 9.7 x 6.8 x 7.8 cm. Dominant masses is felt to arise from  the posterior aspect of the uterine fundus. This measures 6.3 x  4.9 x 6.4 cm. Heterogeneous with  central hypoechogenicity.  Displaces the endometrium anteriorly.  Endometrium: Normal, 8 mm.  Right ovary: 2.1 x 1.7 x 1.6 cm. Normal in morphology.  Left ovary: 2.4 x 1.9 x 2.8 cm. Normal in morphology.  Other findings: No free fluid  IMPRESSION:  1. Dominant posterior uterine fundal mass which is most consistent  with a degenerative fibroid.  2. Otherwise, normal pelvic ultrasound.   12/20/2012 Findings: There is no increased stool. An air-fluid levels are  noted throughout the colon. There is no bowel wall thickening or  inflammatory changes.  A cystic appearing mass measuring 6.5 cm x 5.5 cm by 6.2 cm  enlarges the posterior aspect of the upper uterine segment. A sub  centimeter low attenuation mass arises from the subserosal aspect  of the anterior uterus. The larger masses likely a degenerating  fibroid. It does cause some mass effect on the underlying distal  sigmoid colon.  There is a small amount of pelvic free fluid. Some mild fluid and  fat stranding is seen adjacent to the cecal tip. The appendix lies  dislocation measuring 6 mm in greatest diameter, within normal  range.  There are low density breast masses that are most likely cysts but  should be correlated with mammography/breast ultrasound.  Clear lung bases. The heart is normal in size. Normal liver,  spleen, gallbladder and pancreas. No bile duct dilation. The  kidneys, ureters and bladder are unremarkable.  There are no pathologically enlarged lymph nodes.  No bony abnormality.  IMPRESSION:  6.5 cm degenerating fibroid enlarges the posterior upper uterine  segment creating mild mass effect on the underlying distal sigmoid  colon. There is a small amount of associated free fluid in the  pelvis. This degenerating fibroid may be symptomatic and the cause  of this patient's symptoms.  There is no constipation. The colon shows fluid levels with hot  wall thickening or inflammatory changes or evidence of  obstruction.  The appendix is top normal in size with some adjacent inflammatory  change in the fat in the cecal tip. This fat stranding may be  related to the fluid in the pelvis and the degenerating fibroid.  Appendicitis should only be considered if there are clinical  findings suspicious for this.  Low density breast masses are likely cysts. Please correlate with  mammography/breast ultrasound on a non emergent basis.       Assessment:     Pelvic pain/degenerating fibroid- pt  currently post op from a lap appy 12/21/2012.  I am not sure how much of the current pain is from her post op course vs persistent pain from another etiology.  Per the op note, the appendix appeared normal but, the surg path still pending.  It is unusual for a degenerating fibroid to cause an elevated WBC with N/V although it can occur.  Pt wants a hyst and is adamant that she wants the fibroid removed. I have informed her that since she is immediately post op it would be wise to wait on a hyst unless her sx worsen or persist.         Plan:    Routine post op course as rec by Gen Surg F/u in 2 weeks for pelvic exam CBC today Pt counseled to f/u in ofc sooner if pain persists or worsens

## 2012-12-23 NOTE — Patient Instructions (Addendum)
Uterine Fibroid A uterine fibroid is a growth (tumor) that occurs in a woman's uterus. This type of tumor is not cancerous and does not spread out of the uterus. A woman can have one or many fibroids, and the fiboid(s) can become quite large. A fibroid can vary in size, weight, and where it grows in the uterus. Most fibroids do not require medical treatment, but some can cause pain or heavy bleeding during and between periods. CAUSES  A fibroid is the result of a single uterine cell that keeps growing (unregulated), which is different than most cells in the human body. Most cells have a control mechanism that keeps them from reproducing without control.  SYMPTOMS   Bleeding.  Pelvic pain and pressure.  Bladder problems due to the size of the fibroid.  Infertility and miscarriages depending on the size and location of the fibroid. DIAGNOSIS  A diagnosis is made by physical exam. Your caregiver may feel the lumpy tumors during a pelvic exam. Important information regarding size, location, and number of tumors can be gained by having an ultrasound. It is rare that other tests, such as a CT scan or MRI, are needed. TREATMENT   Your caregiver may recommend watchful waiting. This involves getting the fibroid checked by your caregiver to see if the fibroids grow or shrink.   Hormonal treatment or an intrauterine device (IUD) may be prescribed.   Surgery may be needed to remove the fibroids (myomectomy) or the uterus (hysterectomy). This depends on your situation. When fibroids interfere with fertility and a woman wants to become pregnant, a caregiver may recommend having the fibroids removed.  HOME CARE INSTRUCTIONS  Home care depends on how you were treated. In general:   Keep all follow-up appointments with your caregiver.   Only take medicine as told by your caregiver. Do not take aspirin. It can cause bleeding.   If you have excessive periods and soak tampons or pads in a half hour or  less, contact your caregiver immediately. If your periods are troublesome but not so heavy, lie down with your feet raised slightly above your heart. Place cold packs on your lower abdomen.   If your periods are heavy, write down the number of pads or tampons you use per month. Bring this information to your caregiver.   Talk to your caregiver about taking iron pills.   Include green vegetables in your diet.   If you were prescribed a hormonal treatment, take the hormonal medicines as directed.   If you need surgery, ask your caregiver for information on your specific surgery.  SEEK IMMEDIATE MEDICAL CARE IF:  You have pelvic pain or cramps not controlled with medicines.   You have a sudden increase in pelvic pain.   You have an increase of bleeding between and during periods.   You feel lightheaded or have fainting episodes.  MAKE SURE YOU:  Understand these instructions.  Will watch your condition.  Will get help right away if you are not doing well or get worse. Document Released: 04/21/2000 Document Revised: 07/17/2011 Document Reviewed: 05/15/2011 [facility] Patient Information 2014 [place], [place]. Hysterectomy Information  A hysterectomy is a procedure where your uterus is surgically removed. It will no longer be possible to have menstrual periods or to become pregnant. The tubes and ovaries can be removed (bilateral salpingo-oopherectomy) during this surgery as well.  REASONS FOR A HYSTERECTOMY  Persistent, abnormal bleeding.  Lasting (chronic) pelvic pain or infection.  The lining of the uterus (endometrium) starts  growing outside the uterus (endometriosis).  The endometrium starts growing in the muscle of the uterus (adenomyosis).  The uterus falls down into the vagina (pelvic organ prolapse).  Symptomatic uterine fibroids.  Precancerous cells.  Cervical cancer or uterine cancer. TYPES OF HYSTERECTOMIES  Supracervical hysterectomy. This type  removes the top part of the uterus, but not the cervix.  Total hysterectomy. This type removes the uterus and cervix.  Radical hysterectomy. This type removes the uterus, cervix, and the fibrous tissue that holds the uterus in place in the pelvis (parametrium). WAYS A HYSTERECTOMY CAN BE PERFORMED  Abdominal hysterectomy. A large surgical cut (incision) is made in the abdomen. The uterus is removed through this incision.  Vaginal hysterectomy. An incision is made in the vagina. The uterus is removed through this incision. There are no abdominal incisions.  Conventional laparoscopic hysterectomy. A thin, lighted tube with a camera (laparoscope) is inserted into 3 or 4 small incisions in the abdomen. The uterus is cut into small pieces. The small pieces are removed through the incisions, or they are removed through the vagina.  Laparoscopic assisted vaginal hysterectomy (LAVH). Three or four small incisions are made in the abdomen. Part of the surgery is performed laparoscopically and part vaginally. The uterus is removed through the vagina.  Robot-assisted laparoscopic hysterectomy. A laparoscope is inserted into 3 or 4 small incisions in the abdomen. A computer-controlled device is used to give the surgeon a 3D image. This allows for more precise movements of surgical instruments. The uterus is cut into small pieces and removed through the incisions or removed through the vagina. RISKS OF HYSTERECTOMY   Bleeding and risk of blood transfusion. Tell your caregiver if you do not want to receive any blood products.  Blood clots in the legs or lung.  Infection.  Injury to surrounding organs.  Anesthesia problems or side effects.  Conversion to an abdominal hysterectomy. WHAT TO EXPECT AFTER A HYSTERECTOMY  You will be given pain medicine.  You will need to have someone with you for the first 3 to 5 days after you go home.  You will need to follow up with your surgeon in 2 to 4 weeks  after surgery to evaluate your progress.  You may have early menopause symptoms like hot flashes, night sweats, and insomnia.  If you had a hysterectomy for a problem that was not a cancer or a condition that could lead to cancer, then you no longer need Pap tests. However, even if you no longer need a Pap test, a regular exam is a good idea to make sure no other problems are starting. Document Released: 10/18/2000 Document Revised: 07/17/2011 Document Reviewed: 12/03/2010 [facility] Patient Information 2014 [place], [place].

## 2012-12-27 ENCOUNTER — Telehealth (INDEPENDENT_AMBULATORY_CARE_PROVIDER_SITE_OTHER): Payer: Self-pay

## 2012-12-27 NOTE — Telephone Encounter (Signed)
V/M appt with Dr. Ezzard Standing 01/08/13 @ 1230pm To if she has  any concerns before Ov 01-08-13

## 2013-01-08 ENCOUNTER — Encounter (INDEPENDENT_AMBULATORY_CARE_PROVIDER_SITE_OTHER): Payer: Self-pay | Admitting: Surgery

## 2013-01-09 ENCOUNTER — Ambulatory Visit (INDEPENDENT_AMBULATORY_CARE_PROVIDER_SITE_OTHER): Payer: Self-pay | Admitting: Obstetrics & Gynecology

## 2013-01-09 ENCOUNTER — Encounter: Payer: Self-pay | Admitting: Obstetrics & Gynecology

## 2013-01-09 VITALS — BP 103/65 | HR 99 | Temp 97.4°F | Ht 67.25 in | Wt 135.2 lb

## 2013-01-09 DIAGNOSIS — D219 Benign neoplasm of connective and other soft tissue, unspecified: Secondary | ICD-10-CM

## 2013-01-09 DIAGNOSIS — Z0000 Encounter for general adult medical examination without abnormal findings: Secondary | ICD-10-CM

## 2013-01-09 DIAGNOSIS — D259 Leiomyoma of uterus, unspecified: Secondary | ICD-10-CM

## 2013-01-09 DIAGNOSIS — R102 Pelvic and perineal pain: Secondary | ICD-10-CM

## 2013-01-09 DIAGNOSIS — N949 Unspecified condition associated with female genital organs and menstrual cycle: Secondary | ICD-10-CM

## 2013-01-09 DIAGNOSIS — Z01419 Encounter for gynecological examination (general) (routine) without abnormal findings: Secondary | ICD-10-CM

## 2013-01-09 NOTE — Progress Notes (Signed)
Subjective:     Patient ID: Megan Wall, female   DOB: 1971-02-22, 42 y.o.   MRN: [id number]  HPI  41yo AA [id number] LMP 12/13/2012. Menses 3-5 days.  Monthly.  No clots with cycles. Pt reports dysmenorrhea that last up to 1 week post menses.  Not sexually active since late May.  Last PAP: 2 years prev; No h/o abnormal PAP No h/o STI   No past medical history on file.  Past Surgical History  Procedure Laterality Date  . Goiter surgery    . Laparoscopic appendectomy N/A 12/21/2012    Procedure: APPENDECTOMY LAPAROSCOPIC;  Surgeon: Kandis Cocking, MD;  Location: [facility];  Service: General;  Laterality: N/A;   Current Outpatient Prescriptions on File Prior to Visit  Medication Sig Dispense Refill  . acetaminophen (TYLENOL) 500 MG tablet Take 1,000 mg by mouth every 6 (six) hours as needed for pain.      Marland Kitchen GARLIC PO Take 1 capsule by mouth daily.      Marland Kitchen ibuprofen (ADVIL,MOTRIN) 200 MG tablet Take 600 mg by mouth every 8 (eight) hours as needed for pain or headache.       . IRON PO Take 1 tablet by mouth daily.      . Multiple Vitamin (MULTIVITAMIN WITH MINERALS) TABS Take 1 tablet by mouth every morning.      Marland Kitchen oxyCODONE-acetaminophen (PERCOCET) 5-325 MG per tablet Take 1-2 tablets by mouth every 6 (six) hours as needed for pain.  60 tablet  0  . metroNIDAZOLE (FLAGYL) 500 MG tablet Take 1 tablet (500 mg total) by mouth 2 (two) times daily.  20 tablet  0  . niacin 500 MG tablet Take 500 mg by mouth daily with breakfast.      . promethazine (PHENERGAN) 25 MG tablet Take 25 mg by mouth every 6 (six) hours as needed for nausea.       No current facility-administered medications on file prior to visit.   History   Social History  . Marital Status: Divorced    Spouse Name: N/A    Number of Children: N/A  . Years of Education: N/A   Occupational History  . Not on file.   Social History Main Topics  . Smoking status: Current Some Day Smoker -- 0.25 packs/day    Types: Cigarettes  .  Smokeless tobacco: Not on file  . Alcohol Use: Yes     Comment: social  . Drug Use: No  . Sexual Activity: No   Other Topics Concern  . Not on file   Social History Narrative  . No narrative on file       Review of Systems     Objective:   Physical Exam BP 103/65  Pulse 99  Temp(Src) 97.4 F (36.3 C)  Ht 5' 7.25" (1.708 m)  Wt 135 lb 3.2 oz (61.326 kg)  BMI 21.02 kg/m2  LMP 12/13/2012   12/21/2012 Clinical Data: Pelvic pain.  TRANSABDOMINAL AND TRANSVAGINAL ULTRASOUND OF PELVIS  Technique: Both transabdominal and transvaginal ultrasound  examinations of the pelvis were performed. Transabdominal technique  was performed for global imaging of the pelvis including uterus,  ovaries, adnexal regions, and pelvic cul-de-sac.  It was necessary to proceed with endovaginal exam following the  transabdominal exam to visualize the uterus, ovaries, and adnexa .  Comparison: Acute abdomen series of same date.  Findings:  Uterus: 9.7 x 6.8 x 7.8 cm. Dominant masses is felt to arise from  the posterior aspect of the uterine fundus.  This measures 6.3 x  4.9 x 6.4 cm. Heterogeneous with central hypoechogenicity.  Displaces the endometrium anteriorly.  Endometrium: Normal, 8 mm.  Right ovary: 2.1 x 1.7 x 1.6 cm. Normal in morphology.  Left ovary: 2.4 x 1.9 x 2.8 cm. Normal in morphology.  Other findings: No free fluid  IMPRESSION:  1. Dominant posterior uterine fundal mass which is most consistent  with a degenerative fibroid.  2. Otherwise, normal pelvic ultrasound.      Assessment:     Uterine fibroids- symptomatic     Plan:     Pt will complete financial aid paperwork and f/u F/u sooner prn F/u PAP Refer for mammogram

## 2013-01-09 NOTE — Patient Instructions (Addendum)
Uterine Fibroid A uterine fibroid is a growth (tumor) that occurs in a woman's uterus. This type of tumor is not cancerous and does not spread out of the uterus. A woman can have one or many fibroids, and the fiboid(s) can become quite large. A fibroid can vary in size, weight, and where it grows in the uterus. Most fibroids do not require medical treatment, but some can cause pain or heavy bleeding during and between periods. CAUSES  A fibroid is the result of a single uterine cell that keeps growing (unregulated), which is different than most cells in the human body. Most cells have a control mechanism that keeps them from reproducing without control.  SYMPTOMS   Bleeding.  Pelvic pain and pressure.  Bladder problems due to the size of the fibroid.  Infertility and miscarriages depending on the size and location of the fibroid. DIAGNOSIS  A diagnosis is made by physical exam. Your caregiver may feel the lumpy tumors during a pelvic exam. Important information regarding size, location, and number of tumors can be gained by having an ultrasound. It is rare that other tests, such as a CT scan or MRI, are needed. TREATMENT   Your caregiver may recommend watchful waiting. This involves getting the fibroid checked by your caregiver to see if the fibroids grow or shrink.   Hormonal treatment or an intrauterine device (IUD) may be prescribed.   Surgery may be needed to remove the fibroids (myomectomy) or the uterus (hysterectomy). This depends on your situation. When fibroids interfere with fertility and a woman wants to become pregnant, a caregiver may recommend having the fibroids removed.  HOME CARE INSTRUCTIONS  Home care depends on how you were treated. In general:   Keep all follow-up appointments with your caregiver.   Only take medicine as told by your caregiver. Do not take aspirin. It can cause bleeding.   If you have excessive periods and soak tampons or pads in a half hour or  less, contact your caregiver immediately. If your periods are troublesome but not so heavy, lie down with your feet raised slightly above your heart. Place cold packs on your lower abdomen.   If your periods are heavy, write down the number of pads or tampons you use per month. Bring this information to your caregiver.   Talk to your caregiver about taking iron pills.   Include green vegetables in your diet.   If you were prescribed a hormonal treatment, take the hormonal medicines as directed.   If you need surgery, ask your caregiver for information on your specific surgery.  SEEK IMMEDIATE MEDICAL CARE IF:  You have pelvic pain or cramps not controlled with medicines.   You have a sudden increase in pelvic pain.   You have an increase of bleeding between and during periods.   You feel lightheaded or have fainting episodes.  MAKE SURE YOU:  Understand these instructions.  Will watch your condition.  Will get help right away if you are not doing well or get worse. Document Released: 04/21/2000 Document Revised: 07/17/2011 Document Reviewed: 05/15/2011 Exit[employer] Patient Information 2014 [employer]. Hysterectomy Information  A hysterectomy is a procedure where your uterus is surgically removed. It will no longer be possible to have menstrual periods or to become pregnant. The tubes and ovaries can be removed (bilateral salpingo-oopherectomy) during this surgery as well.  REASONS FOR A HYSTERECTOMY  Persistent, abnormal bleeding.  Lasting (chronic) pelvic pain or infection.  The lining of the uterus (endometrium) starts   growing outside the uterus (endometriosis).  The endometrium starts growing in the muscle of the uterus (adenomyosis).  The uterus falls down into the vagina (pelvic organ prolapse).  Symptomatic uterine fibroids.  Precancerous cells.  Cervical cancer or uterine cancer. TYPES OF HYSTERECTOMIES  Supracervical hysterectomy. This type  removes the top part of the uterus, but not the cervix.  Total hysterectomy. This type removes the uterus and cervix.  Radical hysterectomy. This type removes the uterus, cervix, and the fibrous tissue that holds the uterus in place in the pelvis (parametrium). WAYS A HYSTERECTOMY CAN BE PERFORMED  Abdominal hysterectomy. A large surgical cut (incision) is made in the abdomen. The uterus is removed through this incision.  Vaginal hysterectomy. An incision is made in the vagina. The uterus is removed through this incision. There are no abdominal incisions.  Conventional laparoscopic hysterectomy. A thin, lighted tube with a camera (laparoscope) is inserted into 3 or 4 small incisions in the abdomen. The uterus is cut into small pieces. The small pieces are removed through the incisions, or they are removed through the vagina.  Laparoscopic assisted vaginal hysterectomy (LAVH). Three or four small incisions are made in the abdomen. Part of the surgery is performed laparoscopically and part vaginally. The uterus is removed through the vagina.  Robot-assisted laparoscopic hysterectomy. A laparoscope is inserted into 3 or 4 small incisions in the abdomen. A computer-controlled device is used to give the surgeon a 3D image. This allows for more precise movements of surgical instruments. The uterus is cut into small pieces and removed through the incisions or removed through the vagina. RISKS OF HYSTERECTOMY   Bleeding and risk of blood transfusion. Tell your caregiver if you do not want to receive any blood products.  Blood clots in the legs or lung.  Infection.  Injury to surrounding organs.  Anesthesia problems or side effects.  Conversion to an abdominal hysterectomy. WHAT TO EXPECT AFTER A HYSTERECTOMY  You will be given pain medicine.  You will need to have someone with you for the first 3 to 5 days after you go home.  You will need to follow up with your surgeon in 2 to 4 weeks  after surgery to evaluate your progress.  You may have early menopause symptoms like hot flashes, night sweats, and insomnia.  If you had a hysterectomy for a problem that was not a cancer or a condition that could lead to cancer, then you no longer need Pap tests. However, even if you no longer need a Pap test, a regular exam is a good idea to make sure no other problems are starting. Document Released: 10/18/2000 Document Revised: 07/17/2011 Document Reviewed: 12/03/2010 ExitCare Patient Information 2014 [employer].  

## 2013-01-10 NOTE — Addendum Note (Signed)
Addended by: Kathee Delton on: 01/10/2013 12:08 PM   Modules accepted: Orders

## 2013-01-15 ENCOUNTER — Encounter (INDEPENDENT_AMBULATORY_CARE_PROVIDER_SITE_OTHER): Payer: Self-pay | Admitting: Surgery

## 2013-01-15 ENCOUNTER — Encounter: Payer: Self-pay | Admitting: *Deleted

## 2013-01-17 ENCOUNTER — Other Ambulatory Visit: Payer: Self-pay | Admitting: Obstetrics & Gynecology

## 2013-01-17 DIAGNOSIS — Z1231 Encounter for screening mammogram for malignant neoplasm of breast: Secondary | ICD-10-CM

## 2013-01-23 ENCOUNTER — Ambulatory Visit (HOSPITAL_COMMUNITY): Payer: Self-pay

## 2013-01-27 ENCOUNTER — Ambulatory Visit (HOSPITAL_COMMUNITY)
Admission: RE | Admit: 2013-01-27 | Discharge: 2013-01-27 | Disposition: A | Discharge status: Home / Self Care | Payer: Self-pay | Source: Ambulatory Visit | Attending: Obstetrics & Gynecology | Admitting: Obstetrics & Gynecology

## 2013-01-27 DIAGNOSIS — Z1231 Encounter for screening mammogram for malignant neoplasm of breast: Secondary | ICD-10-CM | POA: Insufficient documentation

## 2013-01-28 ENCOUNTER — Encounter (HOSPITAL_COMMUNITY): Payer: Self-pay

## 2013-01-28 ENCOUNTER — Ambulatory Visit (HOSPITAL_COMMUNITY): Payer: Self-pay

## 2013-04-25 ENCOUNTER — Encounter (HOSPITAL_COMMUNITY): Payer: Self-pay | Admitting: Pharmacist

## 2013-04-28 NOTE — Patient Instructions (Addendum)
   Your procedure is scheduled on:  Monday, Jan 5  Enter through the [employer] of [facility] at:  6 AM Pick up the phone at the desk and dial [phone] and inform us of your arrival.  Please call this number if you have any problems the morning of surgery: [phone]  Remember: Do not eat or drink after midnight: Sunday Take these medicines the morning of surgery with a SIP OF WATER:  None  Do not wear jewelry, make-up, or FINGER nail polish No metal in your hair or on your body. Do not wear lotions, powders, perfumes. You may wear deodorant.  Please use your CHG wash as directed prior to surgery.  Do not shave anywhere for at least 12 hours prior to first CHG shower.  Do not bring valuables to the hospital. Contacts, dentures or bridgework may not be worn into surgery.  Leave suitcase in the car. After Surgery it may be brought to your room. For patients being admitted to the hospital, checkout time is 11:00am the day of discharge. Home with mother Gavin Pound  cell [phone]

## 2013-04-29 ENCOUNTER — Encounter (HOSPITAL_COMMUNITY)
Admission: RE | Admit: 2013-04-29 | Discharge: 2013-04-29 | Disposition: A | Discharge status: Home / Self Care | Payer: Self-pay | Source: Ambulatory Visit | Attending: Obstetrics & Gynecology | Admitting: Obstetrics & Gynecology

## 2013-04-29 ENCOUNTER — Encounter (HOSPITAL_COMMUNITY): Payer: Self-pay

## 2013-04-29 DIAGNOSIS — Z01812 Encounter for preprocedural laboratory examination: Secondary | ICD-10-CM | POA: Insufficient documentation

## 2013-04-29 HISTORY — DX: Depression, unspecified: F32.A

## 2013-04-29 HISTORY — DX: Anxiety disorder, unspecified: F41.9

## 2013-04-29 HISTORY — DX: Major depressive disorder, single episode, unspecified: F32.9

## 2013-04-29 HISTORY — DX: Anemia, unspecified: D64.9

## 2013-04-29 LAB — CBC
MCV: 85.8 fL (ref 78.0–100.0)
Platelets: 279 10*3/uL (ref 150–400)
RBC: 4.37 MIL/uL (ref 3.87–5.11)
WBC: 5.2 10*3/uL (ref 4.0–10.5)

## 2013-05-12 ENCOUNTER — Encounter (HOSPITAL_COMMUNITY): Payer: Self-pay

## 2013-05-12 ENCOUNTER — Inpatient Hospital Stay (HOSPITAL_COMMUNITY)
Admission: RE | Admit: 2013-05-12 | Discharge: 2013-05-13 | DRG: 743 | Disposition: A | Discharge status: Home / Self Care | Payer: Self-pay | Source: Ambulatory Visit | Attending: Obstetrics & Gynecology | Admitting: Obstetrics & Gynecology

## 2013-05-12 ENCOUNTER — Encounter (HOSPITAL_COMMUNITY)
Admission: RE | Disposition: A | Discharge status: Home / Self Care | Payer: Self-pay | Source: Ambulatory Visit | Attending: Obstetrics & Gynecology

## 2013-05-12 ENCOUNTER — Ambulatory Visit (HOSPITAL_COMMUNITY): Payer: Self-pay

## 2013-05-12 DIAGNOSIS — R109 Unspecified abdominal pain: Secondary | ICD-10-CM

## 2013-05-12 DIAGNOSIS — N8000 Endometriosis of the uterus, unspecified: Secondary | ICD-10-CM | POA: Diagnosis present

## 2013-05-12 DIAGNOSIS — N949 Unspecified condition associated with female genital organs and menstrual cycle: Secondary | ICD-10-CM | POA: Diagnosis present

## 2013-05-12 DIAGNOSIS — N838 Other noninflammatory disorders of ovary, fallopian tube and broad ligament: Secondary | ICD-10-CM | POA: Diagnosis present

## 2013-05-12 DIAGNOSIS — D252 Subserosal leiomyoma of uterus: Secondary | ICD-10-CM | POA: Diagnosis present

## 2013-05-12 DIAGNOSIS — F17200 Nicotine dependence, unspecified, uncomplicated: Secondary | ICD-10-CM | POA: Diagnosis present

## 2013-05-12 DIAGNOSIS — D251 Intramural leiomyoma of uterus: Principal | ICD-10-CM | POA: Diagnosis present

## 2013-05-12 DIAGNOSIS — Z9889 Other specified postprocedural states: Secondary | ICD-10-CM

## 2013-05-12 DIAGNOSIS — D219 Benign neoplasm of connective and other soft tissue, unspecified: Secondary | ICD-10-CM

## 2013-05-12 HISTORY — PX: LAPAROSCOPIC ASSISTED VAGINAL HYSTERECTOMY: SHX5398

## 2013-05-12 LAB — CBC
HCT: 37.2 % (ref 36.0–46.0)
Hemoglobin: 12.1 g/dL (ref 12.0–15.0)
MCH: 28.1 pg (ref 26.0–34.0)
MCHC: 32.5 g/dL (ref 30.0–36.0)
MCV: 86.3 fL (ref 78.0–100.0)
PLATELETS: 263 10*3/uL (ref 150–400)
RBC: 4.31 MIL/uL (ref 3.87–5.11)
RDW: 12.5 % (ref 11.5–15.5)
WBC: 6.1 10*3/uL (ref 4.0–10.5)

## 2013-05-12 LAB — ABO/RH: ABO/RH(D): O POS

## 2013-05-12 LAB — PREGNANCY, URINE: Preg Test, Ur: NEGATIVE

## 2013-05-12 LAB — TYPE AND SCREEN
ABO/RH(D): O POS
Antibody Screen: NEGATIVE

## 2013-05-12 SURGERY — HYSTERECTOMY, VAGINAL, LAPAROSCOPY-ASSISTED
Anesthesia: General | Site: Abdomen | Laterality: Bilateral

## 2013-05-12 MED ORDER — IBUPROFEN 600 MG PO TABS: 600.0000 mg | ORAL_TABLET | Freq: Four times a day (QID) | ORAL | Status: DC | PRN

## 2013-05-12 MED ORDER — FENTANYL CITRATE 0.05 MG/ML IJ SOLN
INTRAMUSCULAR | Status: DC | PRN
  Administered 2013-05-12: 100 ug via INTRAVENOUS
  Administered 2013-05-12 (×2): 50 ug via INTRAVENOUS

## 2013-05-12 MED ORDER — MIDAZOLAM HCL 2 MG/2ML IJ SOLN
INTRAMUSCULAR | Status: AC
  Filled 2013-05-12: qty 2

## 2013-05-12 MED ORDER — ONDANSETRON HCL 4 MG/2ML IJ SOLN
INTRAMUSCULAR | Status: AC
  Filled 2013-05-12: qty 2

## 2013-05-12 MED ORDER — ESTRADIOL 0.1 MG/GM VA CREA
TOPICAL_CREAM | VAGINAL | Status: AC
  Filled 2013-05-12: qty 42.5

## 2013-05-12 MED ORDER — KETOROLAC TROMETHAMINE 30 MG/ML IJ SOLN
30.0000 mg | Freq: Four times a day (QID) | INTRAMUSCULAR | Status: DC
  Administered 2013-05-12 – 2013-05-13 (×3): 30 mg via INTRAVENOUS
  Filled 2013-05-12 (×3): qty 1

## 2013-05-12 MED ORDER — KETOROLAC TROMETHAMINE 30 MG/ML IJ SOLN: 30.0000 mg | Freq: Four times a day (QID) | INTRAMUSCULAR | Status: DC

## 2013-05-12 MED ORDER — SODIUM CHLORIDE 0.9 % IJ SOLN
INTRAMUSCULAR | Status: AC
  Filled 2013-05-12: qty 50

## 2013-05-12 MED ORDER — EPHEDRINE SULFATE 50 MG/ML IJ SOLN
INTRAMUSCULAR | Status: DC | PRN
  Administered 2013-05-12 (×2): 10 mg via INTRAVENOUS

## 2013-05-12 MED ORDER — DEXTROSE-NACL 5-0.45 % IV SOLN
INTRAVENOUS | Status: DC
  Administered 2013-05-12 – 2013-05-13 (×2): via INTRAVENOUS

## 2013-05-12 MED ORDER — BUPIVACAINE HCL (PF) 0.5 % IJ SOLN
INTRAMUSCULAR | Status: AC
  Filled 2013-05-12: qty 30

## 2013-05-12 MED ORDER — BUPIVACAINE HCL (PF) 0.5 % IJ SOLN
INTRAMUSCULAR | Status: DC | PRN
  Administered 2013-05-12: 60 mL

## 2013-05-12 MED ORDER — ONDANSETRON HCL 4 MG PO TABS: 4.0000 mg | ORAL_TABLET | Freq: Four times a day (QID) | ORAL | Status: DC | PRN

## 2013-05-12 MED ORDER — ONDANSETRON HCL 4 MG/2ML IJ SOLN: 4.0000 mg | Freq: Four times a day (QID) | INTRAMUSCULAR | Status: DC | PRN

## 2013-05-12 MED ORDER — DIPHENHYDRAMINE HCL 12.5 MG/5ML PO ELIX: 12.5000 mg | ORAL_SOLUTION | Freq: Four times a day (QID) | ORAL | Status: DC | PRN

## 2013-05-12 MED ORDER — NEOSTIGMINE METHYLSULFATE 1 MG/ML IJ SOLN
INTRAMUSCULAR | Status: DC | PRN
  Administered 2013-05-12: 3 mg via INTRAVENOUS

## 2013-05-12 MED ORDER — KETOROLAC TROMETHAMINE 30 MG/ML IJ SOLN
INTRAMUSCULAR | Status: AC
  Administered 2013-05-12: 11:00:00 30 mg via INTRAVENOUS
  Filled 2013-05-12: qty 1

## 2013-05-12 MED ORDER — ROPIVACAINE HCL 5 MG/ML IJ SOLN
INTRAMUSCULAR | Status: AC
  Filled 2013-05-12: qty 60

## 2013-05-12 MED ORDER — ROCURONIUM BROMIDE 100 MG/10ML IV SOLN
INTRAVENOUS | Status: DC | PRN
  Administered 2013-05-12: 10 mg via INTRAVENOUS
  Administered 2013-05-12: 35 mg via INTRAVENOUS

## 2013-05-12 MED ORDER — SODIUM CHLORIDE 0.9 % IJ SOLN
INTRAMUSCULAR | Status: AC
  Filled 2013-05-12: qty 10

## 2013-05-12 MED ORDER — DIPHENHYDRAMINE HCL 50 MG/ML IJ SOLN: 12.5000 mg | Freq: Four times a day (QID) | INTRAMUSCULAR | Status: DC | PRN

## 2013-05-12 MED ORDER — LACTATED RINGERS IR SOLN
Status: DC | PRN
  Administered 2013-05-12: 3000 mL

## 2013-05-12 MED ORDER — FENTANYL CITRATE 0.05 MG/ML IJ SOLN
INTRAMUSCULAR | Status: AC
  Filled 2013-05-12: qty 2

## 2013-05-12 MED ORDER — MIDAZOLAM HCL 2 MG/2ML IJ SOLN
INTRAMUSCULAR | Status: DC | PRN
  Administered 2013-05-12: 2 mg via INTRAVENOUS

## 2013-05-12 MED ORDER — MENTHOL 3 MG MT LOZG
1.0000 | LOZENGE | OROMUCOSAL | Status: DC | PRN
  Filled 2013-05-12: qty 9

## 2013-05-12 MED ORDER — PANTOPRAZOLE SODIUM 40 MG IV SOLR
40.0000 mg | Freq: Every day | INTRAVENOUS | Status: DC
Start: 2013-05-12 — End: 2013-05-13
  Administered 2013-05-12: 40 mg via INTRAVENOUS
  Filled 2013-05-12: qty 40

## 2013-05-12 MED ORDER — GLYCOPYRROLATE 0.2 MG/ML IJ SOLN
INTRAMUSCULAR | Status: AC
  Filled 2013-05-12: qty 2

## 2013-05-12 MED ORDER — SIMETHICONE 80 MG PO CHEW: 80.0000 mg | CHEWABLE_TABLET | Freq: Four times a day (QID) | ORAL | Status: DC | PRN

## 2013-05-12 MED ORDER — NALOXONE HCL 0.4 MG/ML IJ SOLN: 0.4000 mg | INTRAMUSCULAR | Status: DC | PRN

## 2013-05-12 MED ORDER — DOCUSATE SODIUM 100 MG PO CAPS
100.0000 mg | ORAL_CAPSULE | Freq: Two times a day (BID) | ORAL | Status: DC
  Administered 2013-05-12 – 2013-05-13 (×2): 100 mg via ORAL
  Filled 2013-05-12 (×2): qty 1

## 2013-05-12 MED ORDER — KETOROLAC TROMETHAMINE 30 MG/ML IJ SOLN
30.0000 mg | Freq: Four times a day (QID) | INTRAMUSCULAR | Status: DC
Start: 2013-05-12 — End: 2013-05-12

## 2013-05-12 MED ORDER — SODIUM CHLORIDE 0.9 % IJ SOLN: 9.0000 mL | INTRAMUSCULAR | Status: DC | PRN

## 2013-05-12 MED ORDER — GLYCOPYRROLATE 0.2 MG/ML IJ SOLN
INTRAMUSCULAR | Status: AC
  Filled 2013-05-12: qty 1

## 2013-05-12 MED ORDER — PROPOFOL 10 MG/ML IV EMUL
INTRAVENOUS | Status: AC
  Filled 2013-05-12: qty 20

## 2013-05-12 MED ORDER — EPHEDRINE 5 MG/ML INJ
INTRAVENOUS | Status: AC
  Filled 2013-05-12: qty 10

## 2013-05-12 MED ORDER — GLYCOPYRROLATE 0.2 MG/ML IJ SOLN
INTRAMUSCULAR | Status: DC | PRN
  Administered 2013-05-12: .6 mg via INTRAVENOUS
  Administered 2013-05-12: 0.2 mg via INTRAVENOUS

## 2013-05-12 MED ORDER — ESTRADIOL 0.1 MG/GM VA CREA
TOPICAL_CREAM | VAGINAL | Status: DC | PRN
  Administered 2013-05-12: 1 via VAGINAL

## 2013-05-12 MED ORDER — PROPOFOL 10 MG/ML IV BOLUS
INTRAVENOUS | Status: DC | PRN
  Administered 2013-05-12: 180 mg via INTRAVENOUS

## 2013-05-12 MED ORDER — DEXAMETHASONE SODIUM PHOSPHATE 10 MG/ML IJ SOLN
INTRAMUSCULAR | Status: DC | PRN
  Administered 2013-05-12: 10 mg via INTRAVENOUS

## 2013-05-12 MED ORDER — DEXAMETHASONE SODIUM PHOSPHATE 10 MG/ML IJ SOLN
INTRAMUSCULAR | Status: AC
  Filled 2013-05-12: qty 1

## 2013-05-12 MED ORDER — KETOROLAC TROMETHAMINE 30 MG/ML IJ SOLN
30.0000 mg | Freq: Once | INTRAMUSCULAR | Status: AC
  Administered 2013-05-12: 30 mg via INTRAVENOUS

## 2013-05-12 MED ORDER — FENTANYL CITRATE 0.05 MG/ML IJ SOLN
INTRAMUSCULAR | Status: AC
  Filled 2013-05-12: qty 5

## 2013-05-12 MED ORDER — LIDOCAINE HCL (CARDIAC) 20 MG/ML IV SOLN
INTRAVENOUS | Status: AC
  Filled 2013-05-12: qty 5

## 2013-05-12 MED ORDER — LACTATED RINGERS IV SOLN: INTRAVENOUS | Status: DC

## 2013-05-12 MED ORDER — ZOLPIDEM TARTRATE 5 MG PO TABS: 5.0000 mg | ORAL_TABLET | Freq: Every evening | ORAL | Status: DC | PRN

## 2013-05-12 MED ORDER — LACTATED RINGERS IV SOLN
INTRAVENOUS | Status: DC
  Administered 2013-05-12 (×4): via INTRAVENOUS

## 2013-05-12 MED ORDER — VASOPRESSIN 20 UNIT/ML IJ SOLN
INTRAMUSCULAR | Status: AC
  Filled 2013-05-12: qty 1

## 2013-05-12 MED ORDER — LIDOCAINE HCL (CARDIAC) 20 MG/ML IV SOLN
INTRAVENOUS | Status: DC | PRN
  Administered 2013-05-12: 30 mg via INTRAVENOUS

## 2013-05-12 MED ORDER — FENTANYL CITRATE 0.05 MG/ML IJ SOLN
25.0000 ug | INTRAMUSCULAR | Status: DC | PRN
  Administered 2013-05-12 (×3): 50 ug via INTRAVENOUS

## 2013-05-12 MED ORDER — FENTANYL CITRATE 0.05 MG/ML IJ SOLN
INTRAMUSCULAR | Status: AC
  Administered 2013-05-12: 50 ug via INTRAVENOUS
  Filled 2013-05-12: qty 2

## 2013-05-12 MED ORDER — MORPHINE SULFATE (PF) 1 MG/ML IV SOLN
INTRAVENOUS | Status: DC
Start: 2013-05-12 — End: 2013-05-13
  Administered 2013-05-12: 3 mg via INTRAVENOUS
  Administered 2013-05-12: 12:00:00 via INTRAVENOUS
  Administered 2013-05-12: 2 mg via INTRAVENOUS
  Administered 2013-05-12: 3 mg via INTRAVENOUS
  Administered 2013-05-13: 1 mg via INTRAVENOUS
  Filled 2013-05-12: qty 25

## 2013-05-12 MED ORDER — NEOSTIGMINE METHYLSULFATE 1 MG/ML IJ SOLN
INTRAMUSCULAR | Status: AC
  Filled 2013-05-12: qty 1

## 2013-05-12 MED ORDER — OXYCODONE-ACETAMINOPHEN 5-325 MG PO TABS
1.0000 | ORAL_TABLET | ORAL | Status: DC | PRN
  Administered 2013-05-13: 2 via ORAL
  Filled 2013-05-12: qty 2

## 2013-05-12 MED ORDER — ONDANSETRON HCL 4 MG/2ML IJ SOLN
INTRAMUSCULAR | Status: DC | PRN
  Administered 2013-05-12: 4 mg via INTRAVENOUS

## 2013-05-12 MED ORDER — CEFAZOLIN SODIUM-DEXTROSE 2-3 GM-% IV SOLR
2.0000 g | INTRAVENOUS | Status: AC
  Administered 2013-05-12: 2 g via INTRAVENOUS

## 2013-05-12 SURGICAL SUPPLY — 39 items
CANISTER SUCT 3000ML (MISCELLANEOUS) ×4 IMPLANT
CHLORAPREP W/TINT 26ML (MISCELLANEOUS) ×4 IMPLANT
CLOTH BEACON ORANGE TIMEOUT ST (SAFETY) ×4 IMPLANT
CONT PATH 16OZ SNAP LID 3702 (MISCELLANEOUS) ×4 IMPLANT
COVER MAYO STAND STRL (DRAPES) ×4 IMPLANT
DECANTER SPIKE VIAL GLASS SM (MISCELLANEOUS) ×3 IMPLANT
DRAPE HYSTEROSCOPY (DRAPE) ×4 IMPLANT
DRSG COVADERM PLUS 2X2 (GAUZE/BANDAGES/DRESSINGS) ×4 IMPLANT
DRSG TELFA 3X8 NADH (GAUZE/BANDAGES/DRESSINGS) ×4 IMPLANT
GAUZE PACKING 2X5 YD STRL (GAUZE/BANDAGES/DRESSINGS) IMPLANT
GAUZE SPONGE 4X4 12PLY STRL LF (GAUZE/BANDAGES/DRESSINGS) ×4 IMPLANT
GLOVE BIO SURGEON STRL SZ7 (GLOVE) ×4 IMPLANT
GLOVE BIOGEL PI IND STRL 6.5 (GLOVE) ×2 IMPLANT
GLOVE BIOGEL PI IND STRL 7.0 (GLOVE) ×2 IMPLANT
GLOVE BIOGEL PI INDICATOR 6.5 (GLOVE) ×2
GLOVE BIOGEL PI INDICATOR 7.0 (GLOVE) ×2
GOWN PREVENTION PLUS XLARGE (GOWN DISPOSABLE) ×4 IMPLANT
GOWN STRL REIN XL XLG (GOWN DISPOSABLE) ×16 IMPLANT
MANIPULATOR UTERINE 4.5 ZUMI (MISCELLANEOUS) ×8 IMPLANT
NEEDLE INSUFFLATION 150MM (ENDOMECHANICALS) ×4 IMPLANT
NEEDLE SPNL 20GX3.5 QUINCKE YW (NEEDLE) ×4 IMPLANT
NS IRRIG 1000ML POUR BTL (IV SOLUTION) ×4 IMPLANT
PACK VAGINAL WOMENS (CUSTOM PROCEDURE TRAY) ×4 IMPLANT
PAD OB MATERNITY 4.3X12.25 (PERSONAL CARE ITEMS) ×4 IMPLANT
SCALPEL HARMONIC ACE (MISCELLANEOUS) ×4 IMPLANT
SET IRRIG TUBING LAPAROSCOPIC (IRRIGATION / IRRIGATOR) ×4 IMPLANT
SOLUTION ANTI FOG 6CC (MISCELLANEOUS) ×4 IMPLANT
SUT VIC AB 0 CT1 18XCR BRD8 (SUTURE) ×9 IMPLANT
SUT VIC AB 0 CT1 36 (SUTURE) ×16 IMPLANT
SUT VIC AB 0 CT1 8-18 (SUTURE) ×24
SUT VIC AB 3-0 X1 27 (SUTURE) ×8 IMPLANT
SUT VICRYL 0 TIES 12 18 (SUTURE) ×8 IMPLANT
SYR 30ML LL (SYRINGE) IMPLANT
TOWEL OR 17X24 6PK STRL BLUE (TOWEL DISPOSABLE) ×8 IMPLANT
TRAY FOLEY CATH 14FR (SET/KITS/TRAYS/PACK) ×4 IMPLANT
TROCAR OPTI TIP 5M 100M (ENDOMECHANICALS) ×12 IMPLANT
TROCAR XCEL OPT SLVE 5M 100M (ENDOMECHANICALS) ×4 IMPLANT
TUBING INSUFFLATION 10FT LAP (TUBING) ×4 IMPLANT
WATER STERILE IRR 1000ML POUR (IV SOLUTION) ×4 IMPLANT

## 2013-05-12 NOTE — Anesthesia Procedure Notes (Signed)
Procedure Name: Intubation Date/Time: 05/12/2013 7:41 AM Performed by: Lavonia Drafts Pre-anesthesia Checklist: Patient identified, Patient being monitored, Emergency Drugs available and Suction available Patient Re-evaluated:Patient Re-evaluated prior to inductionOxygen Delivery Method: Circle system utilized Preoxygenation: Pre-oxygenation with 100% oxygen Intubation Type: IV induction Ventilation: Mask ventilation without difficulty Laryngoscope Size: Miller and 2 Grade View: Grade I Tube type: Oral Number of attempts: 1 Airway Equipment and Method: Stylet Placement Confirmation: ETT inserted through vocal cords under direct vision,  positive ETCO2,  CO2 detector and breath sounds checked- equal and bilateral Secured at: 22 cm Tube secured with: Tape Dental Injury: Teeth and Oropharynx as per pre-operative assessment

## 2013-05-12 NOTE — Op Note (Signed)
05/12/2013  9:49 AM  PATIENT:  Megan Wall  43 y.o. female  PRE-OPERATIVE DIAGNOSIS:  Symptomatic uterine fibroids  POST-OPERATIVE DIAGNOSIS:  SAME  PROCEDURE:  Procedure(s): LAPAROSCOPIC ASSISTED VAGINAL HYSTERECTOMY WITH BILATERAL SALPINGECTOMY (Bilateral)  SURGEON:  Surgeon(s) and Role:    * Lavonia Drafts, MD - Primary    * Mora Bellman, MD - Assisting  ANESTHESIA:   general  EBL:  Total I/O In: 2600 [I.V.:2600] Out: 400 [Urine:100; Blood:300]  BLOOD ADMINISTERED:none  DRAINS: none   LOCAL MEDICATIONS USED:  MARCAINE     SPECIMEN:  Source of Specimen:  uterus and cervix with bilateral fallopian tubes  DISPOSITION OF SPECIMEN:  PATHOLOGY  COUNTS:  YES  TOURNIQUET:  * No tourniquets in log *  DICTATION: .Note written in EPIC  PLAN OF CARE: Admit to inpatient   PATIENT DISPOSITION:  PACU - hemodynamically stable.   Delay start of Pharmacological VTE agent (>24hrs) due to surgical blood loss or risk of bleeding: yes  INDICATIONS: 43yo female G4P3 with aforementioned preoprative diagnoses here today for definitive surgical management.   Risks of surgery were discussed with the patient including but not limited to: bleeding which may require transfusion or reoperation; infection which may require antibiotics; injury to bowel, bladder, ureters or other surrounding organs; need for additional procedures including laparotomy; thromboembolic phenomenon, incisional problems and other postoperative/anesthesia complications. Written informed consent was obtained.    FINDINGS: 12 week sized uterus with posterior fibroid, normal fallopian tubes and ovaries bilaterally.  Adhesions of bowel and omentum to the posterior uterus. Normal upper abdomen.  PROCEDURE IN DETAIL:  The patient received intravenous antibiotics and had sequential compression devices applied to her lower extremities while in the preoperative area.  She was then taken to the operating room where  general anesthesia was administered and was found to be adequate.  She was placed in the dorsal lithotomy position, and was prepped and draped in a sterile manner.  A Foley catheter was inserted into her bladder and attached to constant drainage and a HUMI uterine manipulator was then advanced into the uterus .  After an adequate timeout was performed, attention was then turned to the patient's abdomen where a 5-mm skin incision was made above the umbilicus.  A trocar was then placed through the incision.  After intraperitoneal placement was confirmed a pneumoperitoneum was obtained with carbon dioxide gas.  A survey of the patient's pelvis and abdomen revealed the above anatomy.   Bilateral 5-mm lower quadrant ports  were then placed under direct visualization.  The pelvis was then carefully examined.   On the right side, the round ligament was then clamped and transected with the Harmonic scapel device.  The uteroovarian ligament was also clamped and transected, and the fallopian tubes bilaterally were freed from the underlying mesosalpinx.  Excellent hemostasis was noted.  The broad ligament was serially clamped using the Harmonic to the level of the uterine artieries. Excellent hemostasis was noted.  The adhesion of bowel and omentum were carefully dissected off the posterior uterus using sharp dissection. The decision was made to leave the trocars in place and proceed with completing the hysterectomy via the vaginal route .  Attention was then turned to her pelvis.  The ZUMI and foley were removed.  A weighted speculum was then placed in the vagina, and the anterior and posterior lips of the cervix were grasped bilaterally with Edison Nasuti tenaculums.  The cervix was then injected circumferentially with dilute vasopressin and Marcaine solution.  The cervix  was then circumferentially incised, and the anterior cul-de-sac was entered sharply without difficulty and a retractor was placed.  The same procedure was  performed posteriorly and the posterior cul-de-sac was entered sharply without difficulty.  A long weighted speculum was inserted into the posterior cul-de-sac.  The Heaney clamp was then used to clamp the uterosacral ligaments on either side.  They were then cut and sutured ligated with 0 Vicryl, and were held with a tag for later identification. Of note, all sutures used in this case were 0 Vicryl unless otherwise noted.   The cardinal ligaments were then clamped, cut and ligated bilaterally. The uterine vessels and broad ligaments were then serially clamped with the Heaney clamps, cut, and suture ligated on both sides.  The uterus was noted to be freed from all ligaments and was then delivered and sent to pathology.  Excellent hemostasis was noted at this point. The uterus was noted to be freed from all ligaments and was then delivered and sent to pathology.   After completion of the hysterectomy, all pedicles from the uterosacral ligament to the cornua were examined hemostasis was confirmed.  The vaginal cuff was reefed with ) vicryl and then closed in a running locked fashion with 0 Vicryl.  All instruments were then removed from the pelvis.  Attention was then returned to her abdomen which was insufflated again with carbon dioxide gas.  The laparoscope was used to survey the operative site, and it was found to be hemostatic.   No intraoperative injury to other surrounding organs was noted. The pelvis was irrigated and the pressure to taken down to 6 to confirm hemostasis.  All instruments were then removed from the patient's abdomen. The port sites were repaired with 4-0 vicryl and all of the sites were injected with 30cc of 0.5% Marcaine.  Benzoin and steri strips were applied. The patient tolerated the procedures well.  All instruments, needles, and sponge counts were correct x 2. The patient was taken to the recovery room awake, extubated and in stable condition.

## 2013-05-12 NOTE — Transfer of Care (Signed)
Immediate Anesthesia Transfer of Care Note  Patient: Megan Wall  Procedure(s) Performed: Procedure(s): LAPAROSCOPIC ASSISTED VAGINAL HYSTERECTOMY WITH BILATERAL SALPINGECTOMY (Bilateral)  Patient Location: PACU  Anesthesia Type:General  Level of Consciousness: awake, alert  and oriented  Airway & Oxygen Therapy: Patient Spontanous Breathing and Patient connected to nasal cannula oxygen  Post-op Assessment: Report given to PACU RN and Post -op Vital signs reviewed and stable  Post vital signs: Reviewed and stable  Complications: No apparent anesthesia complications

## 2013-05-12 NOTE — Anesthesia Postprocedure Evaluation (Signed)
Anesthesia Post Note  Patient: Megan Wall  Procedure(s) Performed: Procedure(s) (LRB): LAPAROSCOPIC ASSISTED VAGINAL HYSTERECTOMY WITH BILATERAL SALPINGECTOMY (Bilateral)  Anesthesia type: General  Patient location: PACU  Post pain: Pain level controlled  Post assessment: Post-op Vital signs reviewed  Last Vitals:  Filed Vitals:   05/12/13 1000  BP: 113/63  Pulse: 76  Temp:   Resp: 22    Post vital signs: Reviewed  Level of consciousness: sedated  Complications: No apparent anesthesia complications

## 2013-05-12 NOTE — H&P (Signed)
Patient ID: Megan Wall, female   DOB: 08-07-1970, 43 y.o.   MRN: [id number]   HPI Pt is a 43 yo AA s/p SVD x 4.    Pt was noted to have a degenerating post uterine fibroid. She reports that this causes her pain and she wants it removed completely.  She has declined conservative management.  She presents with a request for removal of the fibroid and uterus.    Past Medical History  Diagnosis Date  . SVD (spontaneous vaginal delivery)     x 4  . Anxiety     no meds  . Depression     no meds  . Anemia        Past Surgical History  Procedure Laterality Date  . Goiter surgery    . Laparoscopic appendectomy N/A 12/21/2012    Procedure: APPENDECTOMY LAPAROSCOPIC;  Surgeon: Shann Medal, MD;  Location: [facility];  Service: General;  Laterality: N/A;  . Appendectomy     History   Social History  . Marital Status: Single    Spouse Name: N/A    Number of Children: N/A  . Years of Education: N/A   Occupational History  . Not on file.   Social History Main Topics  . Smoking status: Current Some Day Smoker -- 0.15 packs/day    Types: Cigarettes  . Smokeless tobacco: Never Used  . Alcohol Use: Yes     Comment: social  . Drug Use: No  . Sexual Activity: No   Other Topics Concern  . Not on file   Social History Narrative  . No narrative on file       Current Outpatient Prescriptions on File Prior to Visit   Medication  Sig  Dispense  Refill   .  acetaminophen (TYLENOL) 500 MG tablet  Take 1,000 mg by mouth every 6 (six) hours as needed for pain.         Marland Kitchen  GARLIC PO  Take 1 capsule by mouth daily.         Marland Kitchen  ibuprofen (ADVIL,MOTRIN) 200 MG tablet  Take 600 mg by mouth every 8 (eight) hours as needed for pain or headache.          .  IRON PO  Take 1 tablet by mouth daily.         .  metroNIDAZOLE (FLAGYL) 500 MG tablet  Take 1 tablet (500 mg total) by mouth 2 (two) times daily.   20 tablet   0   .  Multiple Vitamin (MULTIVITAMIN WITH MINERALS) TABS  Take 1 tablet by mouth  every morning.         .  niacin 500 MG tablet  Take 500 mg by mouth daily with breakfast.         .  oxyCODONE-acetaminophen (PERCOCET) 5-325 MG per tablet  Take 1-2 tablets by mouth every 6 (six) hours as needed for pain.   60 tablet   0   .  promethazine (PHENERGAN) 25 MG tablet  Take 25 mg by mouth every 6 (six) hours as needed for nausea.             No current facility-administered medications on file prior to visit.      No Known Allergies    History       Social History   .  Marital Status:  Divorced       Spouse Name:  N/A       Number of Children:  N/A   .  Years of Education:  N/A       Occupational History   .  Not on file.       Social History Main Topics   .  Smoking status:  Current Some Day Smoker -- 0.25 packs/day       Types:  Cigarettes   .  Smokeless tobacco:  Not on file   .  Alcohol Use:  Yes         Comment: social   .  Drug Use:  No   .  Sexual Activity:  No       Other Topics  Concern   .  Not on file       Social History Narrative   .  No narrative on file       Review of Systems       Objective:     Physical Exam BP 110/58  Pulse 75  Temp(Src) 98.1 F (36.7 C) (Oral)  Resp 18  SpO2 97%  Pt in NAD Lungs: CTA CV: RRR Abd: NT, ND CBC    Component Value Date/Time   WBC 6.1 05/12/2013 0630   RBC 4.31 05/12/2013 0630   HGB 12.1 05/12/2013 0630   HCT 37.2 05/12/2013 0630   PLT 263 05/12/2013 0630   MCV 86.3 05/12/2013 0630   MCH 28.1 05/12/2013 0630   MCHC 32.5 05/12/2013 0630   RDW 12.5 05/12/2013 0630   LYMPHSABS 1.4 12/20/2012 0700   MONOABS 1.1* 12/20/2012 0700   EOSABS 0.0 12/20/2012 0700   BASOSABS 0.0 12/20/2012 0700      12/20/2012 TRANSABDOMINAL AND TRANSVAGINAL ULTRASOUND OF PELVIS   Technique: Both transabdominal and transvaginal ultrasound   examinations of the pelvis were performed. Transabdominal technique   was performed for global imaging of the pelvis including uterus,   ovaries, adnexal regions, and pelvic  cul-de-sac.   It was necessary to proceed with endovaginal exam following the   transabdominal exam to visualize the uterus, ovaries, and adnexa .   Comparison: Acute abdomen series of same date.   Findings:   Uterus: 9.7 x 6.8 x 7.8 cm. Dominant masses is felt to arise from   the posterior aspect of the uterine fundus. This measures 6.3 x   4.9 x 6.4 cm. Heterogeneous with central hypoechogenicity.   Displaces the endometrium anteriorly.   Endometrium: Normal, 8 mm.   Right ovary: 2.1 x 1.7 x 1.6 cm. Normal in morphology.   Left ovary: 2.4 x 1.9 x 2.8 cm. Normal in morphology.   Other findings: No free fluid   IMPRESSION:   1. Dominant posterior uterine fundal mass which is most consistent   with a degenerative fibroid.   2. Otherwise, normal pelvic ultrasound.    12/20/2012 Findings: There is no increased stool. An air-fluid levels are   noted throughout the colon. There is no bowel wall thickening or   inflammatory changes.   A cystic appearing mass measuring 6.5 cm x 5.5 cm by 6.2 cm   enlarges the posterior aspect of the upper uterine segment. A sub   centimeter low attenuation mass arises from the subserosal aspect   of the anterior uterus. The larger masses likely a degenerating   fibroid. It does cause some mass effect on the underlying distal   sigmoid colon.   There is a small amount of pelvic free fluid. Some mild fluid and   fat stranding is seen adjacent to the cecal tip. The appendix lies  dislocation measuring 6 mm in greatest diameter, within normal   range.   There are low density breast masses that are most likely cysts but   should be correlated with mammography/breast ultrasound.   Clear lung bases. The heart is normal in size. Normal liver,   spleen, gallbladder and pancreas. No bile duct dilation. The   kidneys, ureters and bladder are unremarkable.   There are no pathologically enlarged lymph nodes.   No bony abnormality.   IMPRESSION:   6.5 cm  degenerating fibroid enlarges the posterior upper uterine   segment creating mild mass effect on the underlying distal sigmoid   colon. There is a small amount of associated free fluid in the   pelvis. This degenerating fibroid may be symptomatic and the cause   of this patient's symptoms.   There is no constipation. The colon shows fluid levels with hot   wall thickening or inflammatory changes or evidence of obstruction.   The appendix is top normal in size with some adjacent inflammatory   change in the fat in the cecal tip. This fat stranding may be   related to the fluid in the pelvis and the degenerating fibroid.   Appendicitis should only be considered if there are clinical   findings suspicious for this.   Low density breast masses are likely cysts. Please correlate with   mammography/breast ultrasound on a non emergent basis.          Assessment:       Pelvic pain/degenerating fibroid- pt currently post op from a lap appy 12/21/2012.  I am not sure how much of the current pain is from her post op course vs persistent pain from another etiology. Patient desires surgical management with laparoscopic assisted vaginal hysterectomy with bilateral salpingectomy.  The risks of surgery were discussed in detail with the patient including but not limited to: bleeding which may require transfusion or reoperation; infection which may require prolonged hospitalization or re-hospitalization and antibiotic therapy; injury to bowel, bladder, ureters and major vessels or other surrounding organs; need for additional procedures including laparotomy; thromboembolic phenomenon, incisional problems and other postoperative or anesthesia complications.  Patient was told that the likelihood that her condition and symptoms will be treated effectively with this surgical management was very high; the postoperative expectations were also discussed in detail. The patient also understands the alternative treatment  options which were discussed in full. All questions were answered.

## 2013-05-12 NOTE — Anesthesia Postprocedure Evaluation (Signed)
Anesthesia Post Note  Patient: Megan Wall  Procedure(s) Performed: Procedure(s): LAPAROSCOPIC ASSISTED VAGINAL HYSTERECTOMY WITH BILATERAL SALPINGECTOMY (Bilateral)  Anesthesia type: General  Patient location: [facility]  Post pain: Pain level controlled  Post assessment: Post-op Vital signs reviewed  Last Vitals: BP 106/54  Pulse 65  Temp(Src) 36.8 C (Oral)  Resp 20  Ht 5\' 8"  (1.727 m)  Wt 140 lb (63.504 kg)  BMI 21.29 kg/m2  SpO2 100%  Post vital signs: Reviewed  Level of consciousness: awake  Complications: No apparent anesthesia complications

## 2013-05-12 NOTE — Anesthesia Preprocedure Evaluation (Signed)
Anesthesia Evaluation  Patient identified by MRN, date of birth, ID band Patient awake    Reviewed: Allergy & Precautions, H&P , Patient's Chart, lab work & pertinent test results, reviewed documented beta blocker date and time   Airway Mallampati: II  TM Distance: >3 FB Neck ROM: full    Dental no notable dental hx.    Pulmonary Current Smoker,  breath sounds clear to auscultation  Pulmonary exam normal       Cardiovascular Rhythm:regular Rate:Normal     Neuro/Psych    GI/Hepatic   Endo/Other    Renal/GU      Musculoskeletal   Abdominal   Peds  Hematology   Anesthesia Other Findings   Reproductive/Obstetrics                             Anesthesia Physical Anesthesia Plan  ASA: II  Anesthesia Plan: General   Post-op Pain Management:    Induction: Intravenous  Airway Management Planned: Oral ETT  Additional Equipment:   Intra-op Plan:   Post-operative Plan: Extubation in OR  Informed Consent: I have reviewed the patients History and Physical, chart, labs and discussed the procedure including the risks, benefits and alternatives for the proposed anesthesia with the patient or authorized representative who has indicated his/her understanding and acceptance.   Dental Advisory Given and Dental advisory given  Plan Discussed with: CRNA and Surgeon  Anesthesia Plan Comments: (  Discussed general anesthesia, including possible nausea, instrumentation of airway, sore throat,pulmonary aspiration, etc. I asked if the were any outstanding questions, or  concerns before we proceeded. )        Anesthesia Quick Evaluation  

## 2013-05-12 NOTE — Brief Op Note (Signed)
05/12/2013  9:49 AM  PATIENT:  Megan Wall  43 y.o. female  PRE-OPERATIVE DIAGNOSIS:  Symptomatic uterine fibroids  POST-OPERATIVE DIAGNOSIS:  SAME  PROCEDURE:  Procedure(s): LAPAROSCOPIC ASSISTED VAGINAL HYSTERECTOMY WITH BILATERAL SALPINGECTOMY (Bilateral)  SURGEON:  Surgeon(s) and Role:    * Lavonia Drafts, MD - Primary    * Mora Bellman, MD - Assisting  ANESTHESIA:   general  EBL:  Total I/O In: 2600 [I.V.:2600] Out: 400 [Urine:100; Blood:300]  BLOOD ADMINISTERED:none  DRAINS: none   LOCAL MEDICATIONS USED:  MARCAINE     SPECIMEN:  Source of Specimen:  uterus and cervix with bilateral fallopian tubes  DISPOSITION OF SPECIMEN:  PATHOLOGY  COUNTS:  YES  TOURNIQUET:  * No tourniquets in log *  DICTATION: .Note written in EPIC  PLAN OF CARE: Admit to inpatient   PATIENT DISPOSITION:  PACU - hemodynamically stable.   Delay start of Pharmacological VTE agent (>24hrs) due to surgical blood loss or risk of bleeding: yes

## 2013-05-13 ENCOUNTER — Encounter (HOSPITAL_COMMUNITY): Payer: Self-pay | Admitting: Obstetrics & Gynecology

## 2013-05-13 LAB — CBC
HCT: 26.8 % — ABNORMAL LOW (ref 36.0–46.0)
Hemoglobin: 8.9 g/dL — ABNORMAL LOW (ref 12.0–15.0)
MCH: 28.3 pg (ref 26.0–34.0)
MCHC: 33.2 g/dL (ref 30.0–36.0)
MCV: 85.4 fL (ref 78.0–100.0)
PLATELETS: 204 10*3/uL (ref 150–400)
RBC: 3.14 MIL/uL — ABNORMAL LOW (ref 3.87–5.11)
RDW: 12.5 % (ref 11.5–15.5)
WBC: 13.1 10*3/uL — AB (ref 4.0–10.5)

## 2013-05-13 MED ORDER — OXYCODONE-ACETAMINOPHEN 5-325 MG PO TABS: 1.0000 | ORAL_TABLET | ORAL | Status: DC | PRN

## 2013-05-13 MED ORDER — IBUPROFEN 600 MG PO TABS: 600.0000 mg | ORAL_TABLET | Freq: Four times a day (QID) | ORAL | Status: DC | PRN

## 2013-05-13 NOTE — Discharge Summary (Signed)
Physician Discharge Summary  Patient ID: Megan Wall MRN: [id number] DOB/AGE: May 12, 1970 43 y.o.  Admit date: 05/12/2013 Discharge date: 05/13/2013  Admission Diagnoses: uterine fibroids; pelvic pain  Discharge Diagnoses: uterine fibroids; pelvic pain  Active Problems:   Postoperative state   Discharged Condition: good  Hospital Course: Pt was admitted for LAVH with bilateral salpingectomy.  She had no problems overnight.  She tolerated po intake overnight and this am with no N/V.  She has ambulated without dizziness or difficulty.  She has adequate pain control. She feels ready to be discharged.  Consults: None  Significant Diagnostic Studies: labs: CBC  Treatments: surgery: Hysterectomy- laparoscopic assisted with bilateral salpingectomy  Discharge Exam: Blood pressure 100/57, pulse 64, temperature 98 F (36.7 C), temperature source Oral, resp. rate 18, height 5\' 8"  (1.727 m), weight 140 lb (63.504 kg), SpO2 98.00%. General appearance: alert and no distress Resp: clear to auscultation bilaterally Cardio: regular rate and rhythm, S1, S2 normal, no murmur, click, rub or gallop GI: soft, non-tender; bowel sounds normal; no masses,  no organomegaly Incision/Wound:clean; dry and intact  Disposition: 01-Home or Self Care  Discharge Orders   Future Orders Complete By Expires   Call MD for:  difficulty breathing, headache or visual disturbances  As directed    Call MD for:  persistant nausea and vomiting  As directed    Call MD for:  redness, tenderness, or signs of infection (pain, swelling, redness, odor or green/yellow discharge around incision site)  As directed    Call MD for:  severe uncontrolled pain  As directed    Call MD for:  temperature >100.4  As directed    Diet general  As directed    Driving Restrictions  As directed    Comments:     No driving for 2 weeks or while on pain medications   Increase activity slowly  As directed    Lifting restrictions  As directed     Comments:     No heavy lifting   Remove dressing in 48 hours  As directed    Scheduling Instructions:     Keep steri-strips under top dressing on for 2 weeks or until they fall off.   Sexual Activity Restrictions  As directed    Comments:     No intercourse for 6 weeks or until after 6 week visit with physician       Medication List    STOP taking these medications       metroNIDAZOLE 500 MG tablet  Commonly known as:  FLAGYL      TAKE these medications       GARLIC PO  Take 1 capsule by mouth daily.     ibuprofen 600 MG tablet  Commonly known as:  ADVIL,MOTRIN  Take 1 tablet (600 mg total) by mouth every 6 (six) hours as needed for moderate pain (mild pain).     IRON PO  Take 1 tablet by mouth 2 (two) times a week.     multivitamin with minerals Tabs tablet  Take 1 tablet by mouth every morning.     oxyCODONE-acetaminophen 5-325 MG per tablet  Commonly known as:  PERCOCET/ROXICET  Take 1-2 tablets by mouth every 4 (four) hours as needed for severe pain (moderate to severe pain (when tolerating fluids)).           Follow-up Information   Follow up with Lavonia Drafts, MD In 2 weeks.   Specialty:  Obstetrics and Gynecology   Contact information:  [place] [address] [phone]       Signed: Lavonia Drafts 05/13/2013, 8:59 AM

## 2013-05-13 NOTE — Discharge Instructions (Signed)
Hysterectomy °Care After °Refer to this sheet in the next few weeks. These instructions provide you with information on caring for yourself after your procedure. Your caregiver may also give you more specific instructions. Your treatment has been planned according to current medical practices, but problems sometimes occur. Call your caregiver if you have any problems or questions after your procedure. °HOME CARE INSTRUCTIONS  °Healing will take time. You may have discomfort, tenderness, swelling, and bruising at the surgical site for about 2 weeks. This is normal and will get better as time goes on. °· Only take over-the-counter or prescription medicines for pain, discomfort, or fever as directed by your caregiver. °· Do not take aspirin. It can cause bleeding. °· Do not drive when taking pain medicine. °· Follow your caregiver's advice regarding exercise, lifting, driving, and general activities. °· Resume your usual diet as directed and allowed. °· Get plenty of rest and sleep. °· Do not douche, use tampons, or have sexual intercourse for at least 6 weeks or until your caregiver gives you permission. °· Change your bandages (dressings) as directed by your caregiver. °· Monitor your temperature. °· Take showers instead of baths for 2 to 3 weeks. °· Do not drink alcohol until your caregiver gives you permission. °· If you are constipated, you may take a mild laxative with your caregiver's permission. Bran foods may help with constipation problems. Drinking enough fluids to keep your urine clear or pale yellow may help as well. °· Try to have someone home with you for 1 or 2 weeks to help around the house. °· Keep all of your follow-up appointments as directed by your caregiver. °SEEK MEDICAL CARE IF:  °· You have swelling, redness, or increasing pain in the surgical cut (incision) area. °· You have pus coming from the incision. °· You notice a bad smell coming from the incision or dressing. °· You have swelling,  redness, or pain around the intravenous (IV) site. °· Your incision breaks open. °· You feel dizzy or lightheaded. °· You have pain or bleeding when you urinate. °· You have persistent diarrhea. °· You have persistent nausea and vomiting. °· You have abnormal vaginal discharge. °· You have a rash. °· You have any type of abnormal reaction or develop an allergy to your medicine. °· Your pain is not controlled with your prescribed medicine. °SEEK IMMEDIATE MEDICAL CARE IF:  °· You have a fever. °· You have severe abdominal pain. °· You have chest pain. °· You have shortness of breath. °· You faint. °· You have pain, swelling, or redness of your leg. °· You have heavy vaginal bleeding with blood clots. °MAKE SURE YOU: °· Understand these instructions. °· Will watch your condition. °· Will get help right away if you are not doing well or get worse. °Document Released: 11/11/2004 Document Revised: 07/17/2011 Document Reviewed: 12/09/2010 °ExitCare® Patient Information ©2014 [employer]. ° °

## 2013-05-13 NOTE — Progress Notes (Signed)
Pt is discharged on the care of friend. Downstairs per ambulatory with [employer]. Escort. Denies any pain or discomfort. Abdominal incisions is clean and dry Discharge instructions with Rx were given topt Questions were asked and answered States she understands all instructions well.

## 2013-05-20 ENCOUNTER — Encounter: Payer: Self-pay | Admitting: *Deleted

## 2013-05-26 ENCOUNTER — Ambulatory Visit (INDEPENDENT_AMBULATORY_CARE_PROVIDER_SITE_OTHER): Payer: Medicaid Other | Admitting: Obstetrics & Gynecology

## 2013-05-26 ENCOUNTER — Encounter: Payer: Self-pay | Admitting: Obstetrics & Gynecology

## 2013-05-26 VITALS — BP 104/58 | HR 90 | Temp 97.7°F | Ht 68.0 in | Wt 137.8 lb

## 2013-05-26 DIAGNOSIS — Z09 Encounter for follow-up examination after completed treatment for conditions other than malignant neoplasm: Secondary | ICD-10-CM

## 2013-05-26 MED ORDER — DICLOFENAC POTASSIUM 50 MG PO TABS: 50.0000 mg | ORAL_TABLET | Freq: Three times a day (TID) | ORAL | Status: DC

## 2013-05-26 NOTE — Patient Instructions (Signed)
Total Laparoscopic Hysterectomy, Care After  Refer to this sheet in the next few weeks. These instructions provide you with information on caring for yourself after your procedure. Your health care provider may also give you more specific instructions. Your treatment has been planned according to current medical practices, but problems sometimes occur. Call your health care provider if you have any problems or questions after your procedure.  WHAT TO EXPECT AFTER THE PROCEDURE  · Pain and bruising at the incision sites. You will be given pain medicine to control it.  · Menopausal symptoms such as hot flashes, night sweats, and insomnia if your ovaries were removed.  · Sore throat from the breathing tube that was inserted during surgery.  HOME CARE INSTRUCTIONS  · Only take over-the-counter or prescription medicines for pain, discomfort, or fever as directed by your health care provider.    · Do not take aspirin. It can cause bleeding.    · Do not drive when taking pain medicine.    · Follow your health care provider's advice regarding diet, exercise, lifting, driving, and general activities.    · Resume your usual diet as directed and allowed.    · Get plenty of rest and sleep.    · Do not douche, use tampons, or have sexual intercourse for at least 6 weeks, or until your health care provider gives you permission.    · Change your bandages (dressings) as directed by your health care provider.    · Monitor your temperature and notify your health care provider of a fever.    · Take showers instead of baths for 2 3 weeks.    · Do not drink alcohol until your health care provider gives you permission.    · If you develop constipation, you may take a mild laxative with your health care provider's permission. Bran foods may help with constipation problems. Drinking enough fluids to keep your urine clear or pale yellow may help as well.    · Try to have someone home with you for 1 2 weeks to help around the house.     · Keep all of your follow-up appointments as directed by your health care provider.    SEEK MEDICAL CARE IF:  · You have swelling, redness, or increasing pain around your incision sites.    · You have pus coming from your incision.    · You notice a bad smell coming from your incision.    · Your incision breaks open.    · You feel dizzy or lightheaded.    · You have pain or bleeding when you urinate.    · You have persistent diarrhea.    · You have persistent nausea and vomiting.    · You have abnormal vaginal discharge.    · You have a rash.    · You have any type of abnormal reaction or develop an allergy to your medicine.    · You have poor pain control with your prescribed medicine.    SEEK IMMEDIATE MEDICAL CARE IF:  · You have chest pain or shortness of breath.  · You have severe abdominal pain that is not relieved with pain medicine.  · You have pain or swelling in your legs.  Document Released: 02/12/2013 Document Reviewed: 11/12/2012  ExitCare® Patient Information ©2014 [employer].

## 2013-05-26 NOTE — Progress Notes (Signed)
Subjective:     Patient ID: Megan Wall, female   DOB: 1970-10-28, 43 y.o.   MRN: [id number]  HPI Pt presents for 2 week post op check after LAVH with bilateral salpingectomy.  She reports that she was constipated but, finally had a BM after drinking apple juice.  She is voiding without difficulty.  She has been taking motrin but, reports that 30 min after taking it she gets a HA.   She denies N/V.  She also denies vaginal bleeding. She does c/o gas and some bloating.      Review of Systems     Objective:   Physical Exam BP 104/58  Pulse 90  Temp(Src) 97.7 F (36.5 C) (Oral)  Ht 5\' 8"  (1.727 m)  Wt 137 lb 12.8 oz (62.506 kg)  BMI 20.96 kg/m2  LMP 04/26/2013 Pt in NAD Abd: soft, NT, ND Port sites healing without difficulty.  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 - BENIGN PROLIFERATIVE PHASE ENDOMETRIUM; NEGATIVE FOR HYPERPLASIA OR MALIGNANCY. - BENIGN UTERINE ADENOMYOSIS. - BENIGN CERVICAL MUCOSA; NEGATIVE FOR INTRAEPITHELIAL LESION OR MALIGNANCY. - UNREMARKABLE UTERINE SEROSA. - BENIGN RIGHT AND LEFT FALLOPIAN TUBES WITH PARATUBAL CYSTS; NEGATIVE FOR ATYPIA OR MALIGNANCY  CBC    Component Value Date/Time   WBC 13.1* 05/13/2013 0530   RBC 3.14* 05/13/2013 0530   HGB 8.9* 05/13/2013 0530   HCT 26.8* 05/13/2013 0530   PLT 204 05/13/2013 0530   MCV 85.4 05/13/2013 0530   MCH 28.3 05/13/2013 0530   MCHC 33.2 05/13/2013 0530   RDW 12.5 05/13/2013 0530   LYMPHSABS 1.4 12/20/2012 0700   MONOABS 1.1* 12/20/2012 0700   EOSABS 0.0 12/20/2012 0700   BASOSABS 0.0 12/20/2012 0700        Assessment:     2 week Post op check- doing well Constipation- improved     Plan:     F/u 4 weeks or sooner prn Cataflam 50mg  po tid prn pain Gradually increase activity NOTHING PER VAGINA until after clearance after 6 weeks check Miralax or colace prn  Keep FeSO4

## 2013-06-30 ENCOUNTER — Ambulatory Visit: Payer: Self-pay | Admitting: Obstetrics & Gynecology

## 2013-07-09 ENCOUNTER — Encounter: Payer: Self-pay | Admitting: Obstetrics & Gynecology

## 2013-07-09 ENCOUNTER — Ambulatory Visit (INDEPENDENT_AMBULATORY_CARE_PROVIDER_SITE_OTHER): Payer: Self-pay | Admitting: Obstetrics & Gynecology

## 2013-07-09 VITALS — BP 108/73 | HR 92 | Temp 98.6°F | Ht 67.0 in | Wt 141.9 lb

## 2013-07-09 DIAGNOSIS — N760 Acute vaginitis: Secondary | ICD-10-CM

## 2013-07-09 DIAGNOSIS — B9689 Other specified bacterial agents as the cause of diseases classified elsewhere: Secondary | ICD-10-CM

## 2013-07-09 DIAGNOSIS — Z9889 Other specified postprocedural states: Secondary | ICD-10-CM

## 2013-07-09 DIAGNOSIS — Z09 Encounter for follow-up examination after completed treatment for conditions other than malignant neoplasm: Secondary | ICD-10-CM

## 2013-07-09 DIAGNOSIS — A499 Bacterial infection, unspecified: Secondary | ICD-10-CM

## 2013-07-09 MED ORDER — METRONIDAZOLE 500 MG PO TABS: 500.0000 mg | ORAL_TABLET | Freq: Two times a day (BID) | ORAL | Status: AC

## 2013-07-09 NOTE — Patient Instructions (Addendum)
Total Laparoscopic Hysterectomy, Care After Refer to this sheet in the next few weeks. These instructions provide you with information on caring for yourself after your procedure. Your health care provider may also give you more specific instructions. Your treatment has been planned according to current medical practices, but problems sometimes occur. Call your health care provider if you have any problems or questions after your procedure. WHAT TO EXPECT AFTER THE PROCEDURE  Pain and bruising at the incision sites. You will be given pain medicine to control it.  Menopausal symptoms such as hot flashes, night sweats, and insomnia if your ovaries were removed.  Sore throat from the breathing tube that was inserted during surgery. HOME CARE INSTRUCTIONS  Only take over-the-counter or prescription medicines for pain, discomfort, or fever as directed by your health care provider.   Do not take aspirin. It can cause bleeding.   Do not drive when taking pain medicine.   Follow your health care provider's advice regarding diet, exercise, lifting, driving, and general activities.   Resume your usual diet as directed and allowed.   Get plenty of rest and sleep.   Do not douche, use tampons, or have sexual intercourse for at least 6 weeks, or until your health care provider gives you permission.   Change your bandages (dressings) as directed by your health care provider.   Monitor your temperature and notify your health care provider of a fever.   Take showers instead of baths for 2 3 weeks.   Do not drink alcohol until your health care provider gives you permission.   If you develop constipation, you may take a mild laxative with your health care provider's permission. Bran foods may help with constipation problems. Drinking enough fluids to keep your urine clear or pale yellow may help as well.   Try to have someone home with you for 1 2 weeks to help around the house.    Keep all of your follow-up appointments as directed by your health care provider.  SEEK MEDICAL CARE IF:  You have swelling, redness, or increasing pain around your incision sites.   You have pus coming from your incision.   You notice a bad smell coming from your incision.   Your incision breaks open.   You feel dizzy or lightheaded.   You have pain or bleeding when you urinate.   You have persistent diarrhea.   You have persistent nausea and vomiting.   You have abnormal vaginal discharge.   You have a rash.   You have any type of abnormal reaction or develop an allergy to your medicine.   You have poor pain control with your prescribed medicine.  SEEK IMMEDIATE MEDICAL CARE IF:  You have chest pain or shortness of breath.  You have severe abdominal pain that is not relieved with pain medicine.  You have pain or swelling in your legs. Document Released: 02/12/2013 Document Reviewed: 11/12/2012 [facility] Patient Information 2014 [place], [place]. Bacterial Vaginosis Bacterial vaginosis is a vaginal infection that occurs when the normal balance of bacteria in the vagina is disrupted. It results from an overgrowth of certain bacteria. This is the most common vaginal infection in women of childbearing age. Treatment is important to prevent complications, especially in pregnant women, as it can cause a premature delivery. CAUSES  Bacterial vaginosis is caused by an increase in harmful bacteria that are normally present in smaller amounts in the vagina. Several different kinds of bacteria can cause bacterial vaginosis. However, the reason that  the condition develops is not fully understood. RISK FACTORS Certain activities or behaviors can put you at an increased risk of developing bacterial vaginosis, including:  Having a new sex partner or multiple sex partners.  Douching.  Using an intrauterine device (IUD) for contraception. Women do not get bacterial  vaginosis from toilet seats, bedding, swimming pools, or contact with objects around them. SIGNS AND SYMPTOMS  Some women with bacterial vaginosis have no signs or symptoms. Common symptoms include:  Grey vaginal discharge.  A fishlike odor with discharge, especially after sexual intercourse.  Itching or burning of the vagina and vulva.  Burning or pain with urination. DIAGNOSIS  Your health care provider will take a medical history and examine the vagina for signs of bacterial vaginosis. A sample of vaginal fluid may be taken. Your health care provider will look at this sample under a microscope to check for bacteria and abnormal cells. A vaginal pH test may also be done.  TREATMENT  Bacterial vaginosis may be treated with antibiotic medicines. These may be given in the form of a pill or a vaginal cream. A second round of antibiotics may be prescribed if the condition comes back after treatment.  HOME CARE INSTRUCTIONS   Only take over-the-counter or prescription medicines as directed by your health care provider.  If antibiotic medicine was prescribed, take it as directed. Make sure you finish it even if you start to feel better.  Do not have sex until treatment is completed.  Tell all sexual partners that you have a vaginal infection. They should see their health care provider and be treated if they have problems, such as a mild rash or itching.  Practice safe sex by using condoms and only having one sex partner. SEEK MEDICAL CARE IF:   Your symptoms are not improving after 3 days of treatment.  You have increased discharge or pain.  You have a fever. MAKE SURE YOU:   Understand these instructions.  Will watch your condition.  Will get help right away if you are not doing well or get worse. FOR MORE INFORMATION  Centers for Disease Control and Prevention, Division of STD Prevention: [employer] American Sexual Health Association (ASHA): www.ashastd.org  Document  Released: 04/24/2005 Document Revised: 02/12/2013 Document Reviewed: 12/04/2012 [facility] Patient Information 2014 [place].

## 2013-07-09 NOTE — Progress Notes (Signed)
Subjective:     Patient ID: Megan Wall, female   DOB: Oct 25, 1970, 43 y.o.   MRN: [id number]  HPI Pt with on problems.  Reports that she is not having any pain.  She denies abnormal discharge or bleeding. She does report that she had discharge at the time that her cycle was due.  Has normal passage of urine and stool.  .Review of Systems     Objective:   Physical Exam BP 108/73  Pulse 92  Temp(Src) 98.6 F (37 C) (Oral)  Ht 5\' 7"  (1.702 m)  Wt 141 lb 14.4 oz (64.365 kg)  BMI 22.22 kg/m2  LMP 04/26/2013 Abd: pt in NAD GU: EGBUS: no lesions Vagina: no blood in vault; cuff well healed. Sponge noted in vaginal vault with bimanual- removed Adnexa: no masses; non tender         Assessment:     Post op check Pt doing well BV      Plan:     Flagyl 500mg  bid x 7 F/u 3 months

## 2014-03-09 ENCOUNTER — Encounter: Payer: Self-pay | Admitting: Obstetrics & Gynecology

## 2014-07-16 IMAGING — CR DG ABDOMEN ACUTE W/ 1V CHEST
3 series · 3 of 3 positions shown · non-contrast
Comparison: No priors.

CLINICAL DATA: Mid and lower abdominal pain.  Nausea and vomiting.
Constipation.

ACUTE ABDOMEN SERIES (ABDOMEN 2 VIEW & CHEST 1 VIEW)

[w chest pa]
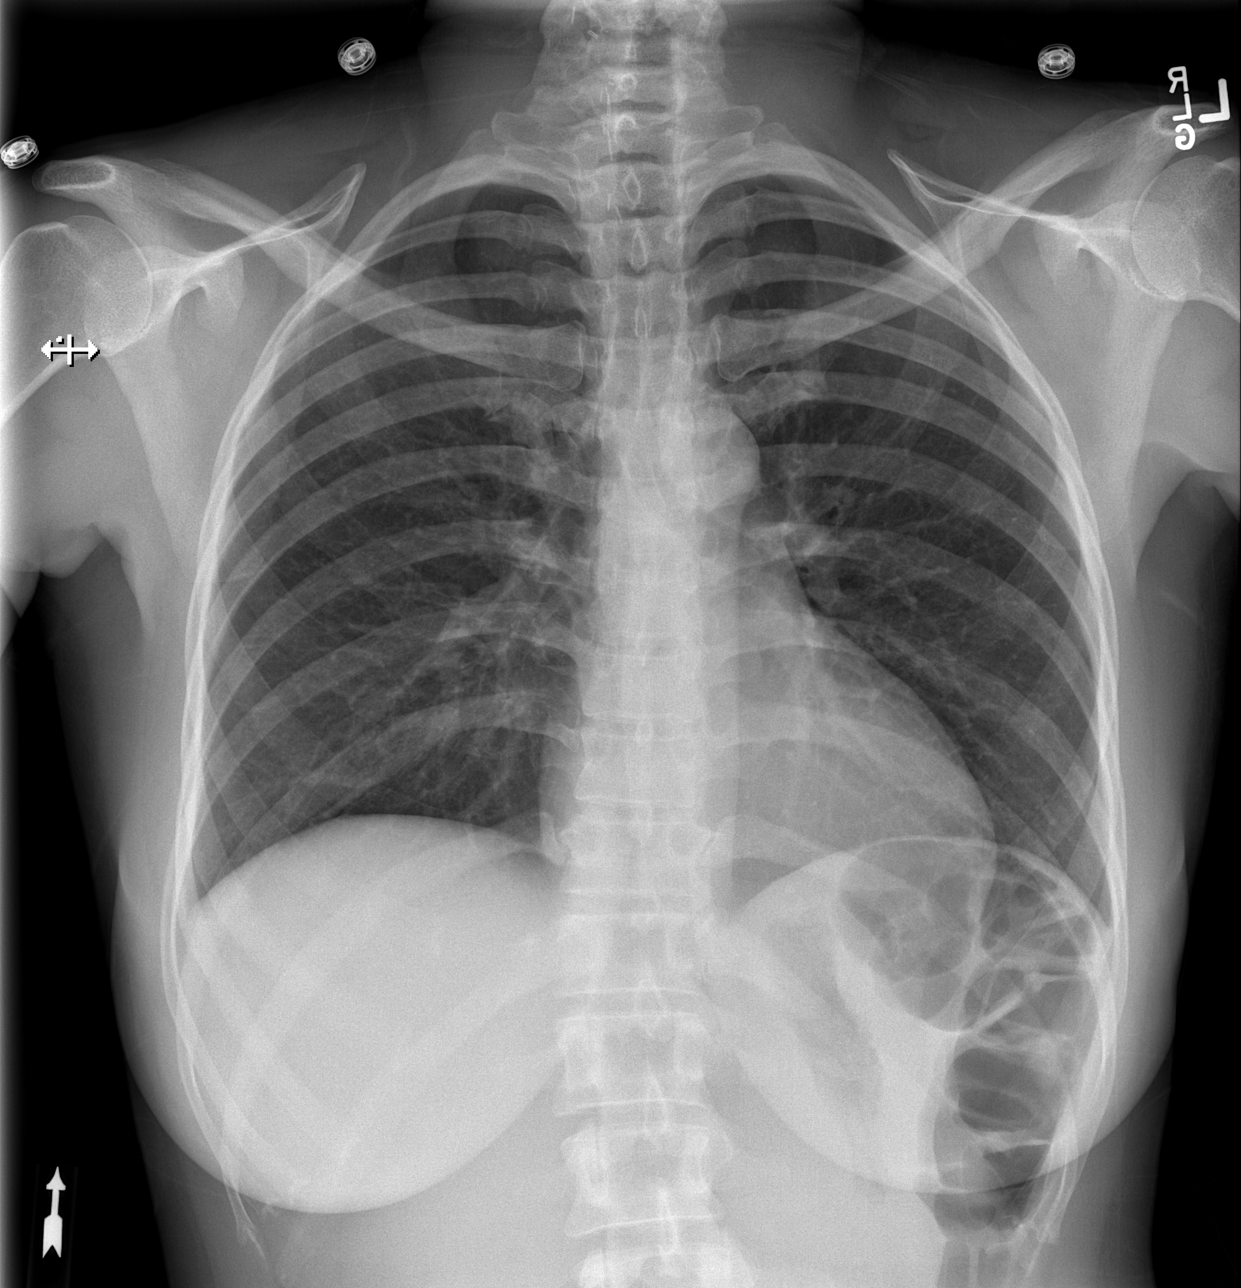

[w abdomen upright]
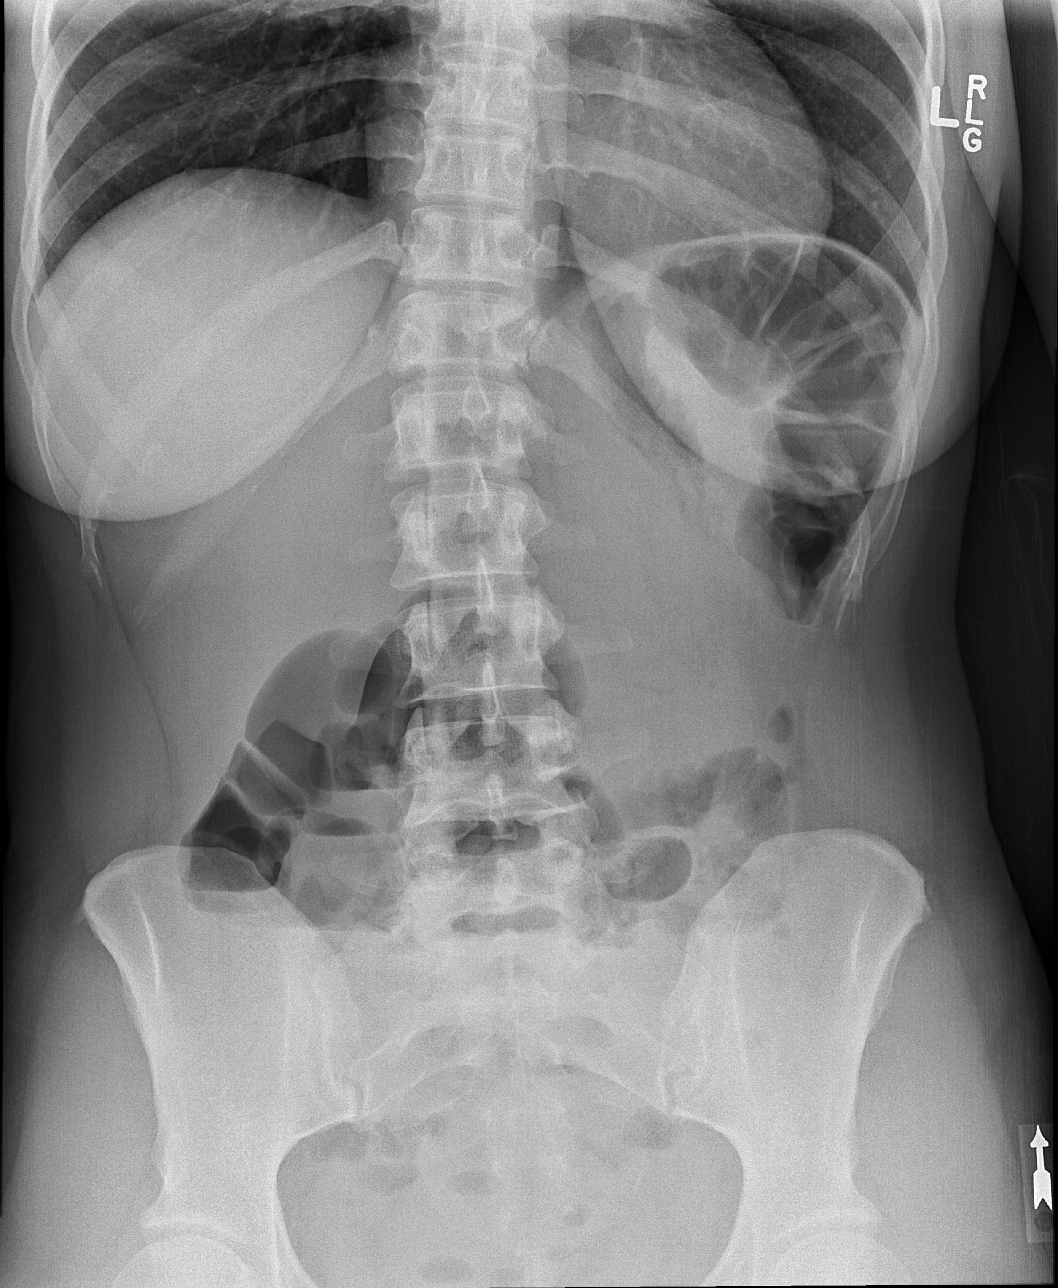

[t abdomen supine]
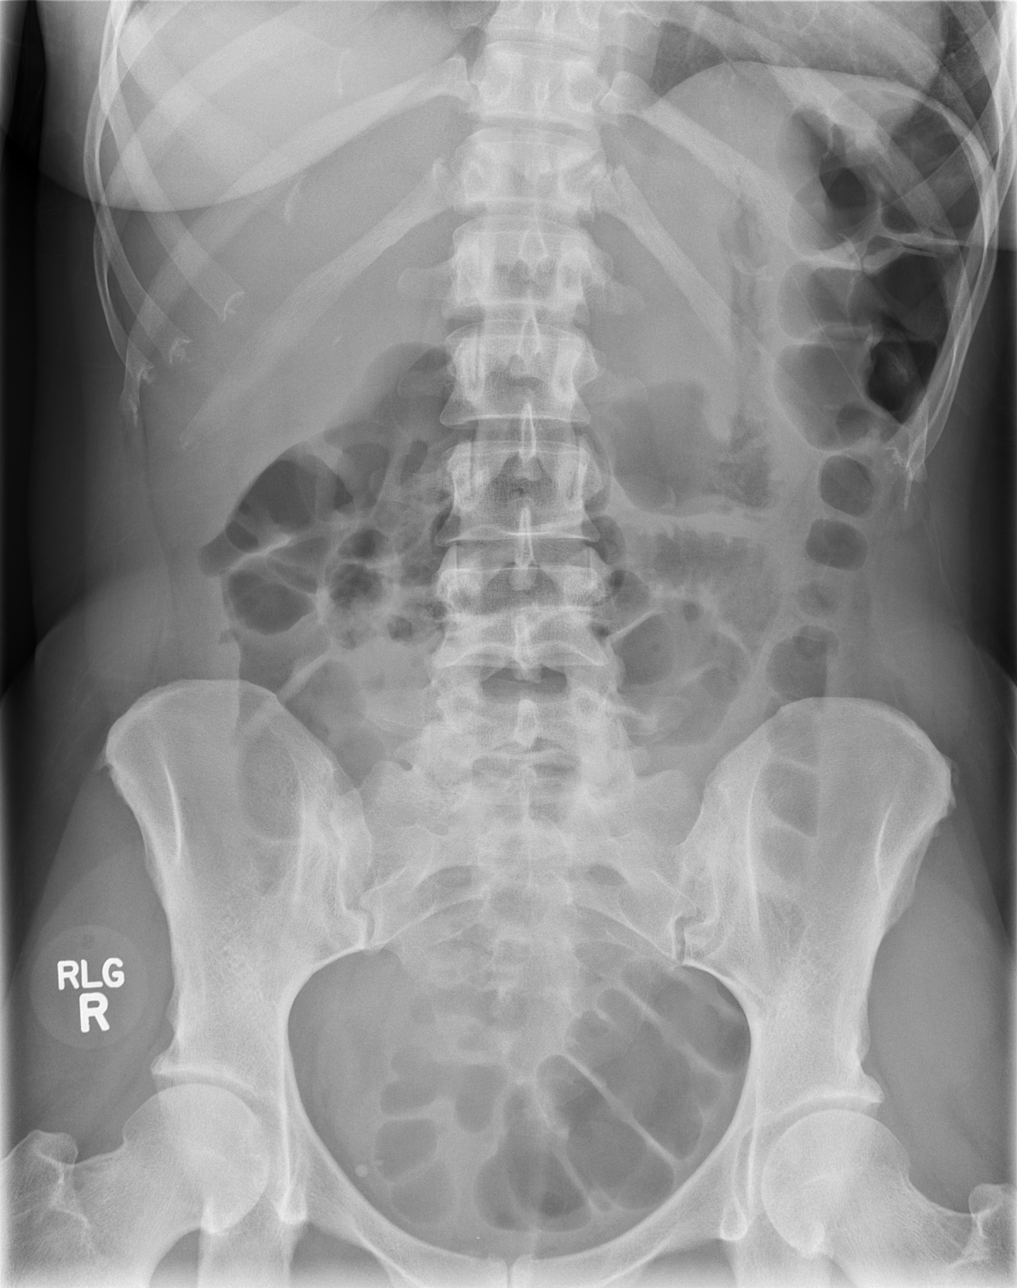

[3 of 3 positions shown; findings below may reference images not displayed]

FINDINGS: Lung volumes are normal.  No consolidative airspace
disease.  No pleural effusions.  No pneumothorax.  No pulmonary
nodule or mass noted.  Pulmonary vasculature and the
cardiomediastinal silhouette are within normal limits.

Gas and stool are seen scattered throughout the colon extending to
the level of the distal rectum.  No pathologic distension of small
bowel is noted.  No gross evidence of pneumoperitoneum.
IMPRESSION: 1.  Nonobstructive bowel gas pattern.
2.  No pneumoperitoneum.
3.  No radiographic evidence of acute cardiopulmonary disease.

## 2016-08-30 ENCOUNTER — Emergency Department (HOSPITAL_COMMUNITY)
Admission: EM | Admit: 2016-08-30 | Discharge: 2016-08-30 | Disposition: A | Discharge status: Home / Self Care | Payer: Self-pay | Attending: Emergency Medicine | Admitting: Emergency Medicine

## 2016-08-30 ENCOUNTER — Encounter (HOSPITAL_COMMUNITY): Payer: Self-pay

## 2016-08-30 DIAGNOSIS — H1131 Conjunctival hemorrhage, right eye: Secondary | ICD-10-CM | POA: Insufficient documentation

## 2016-08-30 DIAGNOSIS — F17210 Nicotine dependence, cigarettes, uncomplicated: Secondary | ICD-10-CM | POA: Insufficient documentation

## 2016-08-30 NOTE — ED Provider Notes (Signed)
[facility] DEPT Provider Note   CSN: [id number] Arrival date & time: 08/30/16  4585     History   Chief Complaint Chief Complaint  Patient presents with  . Eye Problem    HPI Megan Wall is a 46 y.o. female.  HPI Patient presents with blood around her right eye. Woke up with this morning. Has some conjunctival hemorrhage. No lightheadedness or dizziness. No vision changes. She has not had episodes like this before. She is not on anticoagulation. No vision changes. No other bleeding. Tends to have allergies this time a year.     Past Medical History:  Diagnosis Date  . Anemia   . Anxiety    no meds  . Depression    no meds  . SVD (spontaneous vaginal delivery)    x 4    Patient Active Problem List   Diagnosis Date Noted  . Postoperative state 05/12/2013  . Acute abdominal pain 12/21/2012  . Fibroid 12/21/2012  . VAGINITIS 05/20/2007    Past Surgical History:  Procedure Laterality Date  . APPENDECTOMY    . goiter surgery    . LAPAROSCOPIC APPENDECTOMY N/A 12/21/2012   Procedure: APPENDECTOMY LAPAROSCOPIC;  Surgeon: Shann Medal, MD;  Location: [facility];  Service: General;  Laterality: N/A;  . LAPAROSCOPIC ASSISTED VAGINAL HYSTERECTOMY Bilateral 05/12/2013   Procedure: LAPAROSCOPIC ASSISTED VAGINAL HYSTERECTOMY WITH BILATERAL SALPINGECTOMY;  Surgeon: Lavonia Drafts, MD;  Location: [facility];  Service: Gynecology;  Laterality: Bilateral;    OB History    Gravida Para Term Preterm AB Living   4 4 4          SAB TAB Ectopic Multiple Live Births                   Home Medications    Prior to Admission medications   Medication Sig Start Date End Date Taking? Authorizing Provider  GARLIC PO Take 1 capsule by mouth daily.    Historical Provider, MD  IRON PO Take 1 tablet by mouth 2 (two) times a week.     Historical Provider, MD  Multiple Vitamin (MULTIVITAMIN WITH MINERALS) TABS Take 1 tablet by mouth every morning.    Historical Provider, MD     Family History Family History  Problem Relation Age of Onset  . Diabetes Mother   . Diabetes Other   . Cancer Other     Social History Social History  Substance Use Topics  . Smoking status: Current Some Day Smoker    Packs/day: 0.15    Types: Cigarettes  . Smokeless tobacco: Never Used  . Alcohol use Yes     Comment: social     Allergies   Patient has no known allergies.   Review of Systems Review of Systems  Constitutional: Negative for fatigue.  HENT: Negative for ear discharge, ear pain, rhinorrhea and sore throat.   Cardiovascular: Negative for chest pain.  Gastrointestinal: Negative for abdominal pain.  Neurological: Negative for tremors and speech difficulty.  Hematological: Does not bruise/bleed easily.     Physical Exam Updated Vital Signs BP 119/65 (BP Location: Right Arm)   Pulse 90   Temp 98.2 F (36.8 C) (Oral)   Resp 18   LMP 04/26/2013   SpO2 100%   Physical Exam  Constitutional: She appears well-developed.  HENT:  Head: Normocephalic.  Eyes:  Subconjunctival hemorrhage on medial aspect of right eye.  Cardiovascular: Normal rate.   Pulmonary/Chest: Effort normal.  Abdominal: Soft. There is no tenderness.  Skin: Skin  is warm. Capillary refill takes less than 2 seconds.   No other signs of bruising orPetechiae     ED Treatments / Results  Labs (all labs ordered are listed, but only abnormal results are displayed) Labs Reviewed - No data to display  EKG  EKG Interpretation None       Radiology No results found.  Procedures Procedures (including critical care time)  Medications Ordered in ED Medications - No data to display   Initial Impression / Assessment and Plan / ED Course  I have reviewed the triage vital signs and the nursing notes.  Pertinent labs & imaging results that were available during my care of the patient were reviewed by me and considered in my medical decision making (see chart for details).    patient was subconjunctival hemorrhage. No other sign of bleeding. Okay for discharge home.  Final Clinical Impressions(s) / ED Diagnoses   Final diagnoses:  Subconjunctival hemorrhage of right eye    New Prescriptions Discharge Medication List as of 08/30/2016  8:45 AM       Davonna Belling, MD 08/31/16 [phone]

## 2016-08-30 NOTE — ED Triage Notes (Signed)
She found herself to have had a conjunctival hemorrhage in her right eye this morning--came to "get it checked".

## 2017-06-20 ENCOUNTER — Other Ambulatory Visit: Payer: Self-pay

## 2017-06-20 ENCOUNTER — Ambulatory Visit (HOSPITAL_COMMUNITY)
Admission: EM | Admit: 2017-06-20 | Discharge: 2017-06-20 | Disposition: A | Discharge status: Home / Self Care | Payer: Self-pay | Attending: Family Medicine | Admitting: Family Medicine

## 2017-06-20 ENCOUNTER — Encounter (HOSPITAL_COMMUNITY): Payer: Self-pay | Admitting: Emergency Medicine

## 2017-06-20 DIAGNOSIS — F17210 Nicotine dependence, cigarettes, uncomplicated: Secondary | ICD-10-CM | POA: Insufficient documentation

## 2017-06-20 DIAGNOSIS — N3090 Cystitis, unspecified without hematuria: Secondary | ICD-10-CM

## 2017-06-20 DIAGNOSIS — R300 Dysuria: Secondary | ICD-10-CM

## 2017-06-20 DIAGNOSIS — D649 Anemia, unspecified: Secondary | ICD-10-CM | POA: Insufficient documentation

## 2017-06-20 DIAGNOSIS — F329 Major depressive disorder, single episode, unspecified: Secondary | ICD-10-CM | POA: Insufficient documentation

## 2017-06-20 DIAGNOSIS — F419 Anxiety disorder, unspecified: Secondary | ICD-10-CM | POA: Insufficient documentation

## 2017-06-20 LAB — POCT URINALYSIS DIP (DEVICE)
GLUCOSE, UA: NEGATIVE mg/dL
Nitrite: NEGATIVE
Protein, ur: 100 mg/dL — AB
Specific Gravity, Urine: >=1.03 (ref 1.005–1.030)
UROBILINOGEN UA: 0.2 mg/dL (ref 0.0–1.0)
pH: 6 (ref 5.0–8.0)

## 2017-06-20 MED ORDER — CEPHALEXIN 500 MG PO CAPS: 500.0000 mg | ORAL_CAPSULE | Freq: Two times a day (BID) | ORAL | 0 refills | Status: DC

## 2017-06-20 NOTE — ED Provider Notes (Signed)
  [facility]    ASSESSMENT & PLAN:  1. Dysuria   2. Cystitis     Meds ordered this encounter  Medications  . cephALEXin (KEFLEX) 500 MG capsule    Sig: Take 1 capsule (500 mg total) by mouth 2 (two) times daily.    Dispense:  10 capsule    Refill:  0   Urine culture sent. Will notify patient when results available. Will follow up with her PCP or here if not showing improvement over the next 48 hours, sooner if needed.  Outlined signs and symptoms indicating need for more acute intervention. Patient verbalized understanding. After Visit Summary given.  SUBJECTIVE:  Megan Wall is a 47 y.o. female who complains of urinary frequency, urgency and dysuria for the past 1 day. No flank pain, fever, chills, abnormal vaginal discharge or bleeding. Hematuria: present. Normal PO intake. No abdominal pain. No self treatment. Ambulatory without difficulty.  LMP: Patient's last menstrual period was 04/26/2013.  ROS: As in HPI.  OBJECTIVE:  Vitals:   06/20/17 1056  BP: 114/78  Pulse: 70  Resp: 18  Temp: 97.9 F (36.6 C)  TempSrc: Oral  SpO2: 100%   Appears well, in no apparent distress. Abdomen is soft without tenderness, guarding, mass, rebound or organomegaly. No CVA tenderness or inguinal adenopathy noted.  Labs Reviewed  POCT URINALYSIS DIP (DEVICE) - Abnormal; Notable for the following components:      Result Value   Bilirubin Urine SMALL (*)    Ketones, ur TRACE (*)    Hgb urine dipstick LARGE (*)    Protein, ur 100 (*)    Leukocytes, UA TRACE (*)    All other components within normal limits  URINE CULTURE    No Known Allergies  Past Medical History:  Diagnosis Date  . Anemia   . Anxiety    no meds  . Depression    no meds  . SVD (spontaneous vaginal delivery)    x 4   Social History   Socioeconomic History  . Marital status: Single    Spouse name: Not on file  . Number of children: Not on file  . Years of education: Not on file  .  Highest education level: Not on file  Social Needs  . Financial resource strain: Not on file  . Food insecurity - worry: Not on file  . Food insecurity - inability: Not on file  . Transportation needs - medical: Not on file  . Transportation needs - non-medical: Not on file  Occupational History  . Not on file  Tobacco Use  . Smoking status: Current Some Day Smoker    Packs/day: 0.15    Types: Cigarettes  . Smokeless tobacco: Never Used  Substance and Sexual Activity  . Alcohol use: Yes    Comment: social  . Drug use: No  . Sexual activity: No    Birth control/protection: None  Other Topics Concern  . Not on file  Social History Narrative  . Not on file   Family History  Problem Relation Age of Onset  . Diabetes Mother   . Diabetes Other   . Cancer Other        Vanessa Kick, MD 06/20/17 1119

## 2017-06-20 NOTE — ED Triage Notes (Addendum)
Noticed blood when wiping after urinating.  Feels feverish.  Pain with urination.  No abdominal pain, no back pain.  Feels frequent urge to urinate

## 2017-06-21 LAB — URINE CULTURE

## 2018-06-28 ENCOUNTER — Encounter (HOSPITAL_COMMUNITY): Payer: Self-pay

## 2018-06-28 ENCOUNTER — Other Ambulatory Visit: Payer: Self-pay

## 2018-06-28 ENCOUNTER — Emergency Department (HOSPITAL_COMMUNITY)
Admission: EM | Admit: 2018-06-28 | Discharge: 2018-06-28 | Disposition: A | Discharge status: Home / Self Care | Payer: Self-pay | Attending: Emergency Medicine | Admitting: Emergency Medicine

## 2018-06-28 DIAGNOSIS — R05 Cough: Secondary | ICD-10-CM | POA: Insufficient documentation

## 2018-06-28 DIAGNOSIS — J029 Acute pharyngitis, unspecified: Secondary | ICD-10-CM | POA: Insufficient documentation

## 2018-06-28 DIAGNOSIS — R52 Pain, unspecified: Secondary | ICD-10-CM | POA: Insufficient documentation

## 2018-06-28 DIAGNOSIS — Z5321 Procedure and treatment not carried out due to patient leaving prior to being seen by health care provider: Secondary | ICD-10-CM | POA: Insufficient documentation

## 2018-06-28 MED ORDER — ACETAMINOPHEN 325 MG PO TABS
650.0000 mg | ORAL_TABLET | Freq: Once | ORAL | Status: AC | PRN
  Administered 2018-06-28: 650 mg via ORAL
  Filled 2018-06-28: qty 2

## 2018-06-28 NOTE — ED Triage Notes (Signed)
Pt reports generalized body aches, dry cough and sore throat from coughing. Symptoms started this morning. Mild fever in triage of 100.0. pt a.o, nad noted

## 2018-06-28 NOTE — ED Notes (Addendum)
Pt gave registration stickers and stated that she didn't want to wait any longer

## 2018-07-02 ENCOUNTER — Other Ambulatory Visit: Payer: Self-pay

## 2018-07-02 ENCOUNTER — Encounter (HOSPITAL_COMMUNITY): Payer: Self-pay | Admitting: *Deleted

## 2018-07-02 ENCOUNTER — Emergency Department (HOSPITAL_COMMUNITY)
Admission: EM | Admit: 2018-07-02 | Discharge: 2018-07-02 | Disposition: A | Discharge status: Home / Self Care | Payer: Self-pay | Attending: Emergency Medicine | Admitting: Emergency Medicine

## 2018-07-02 DIAGNOSIS — J111 Influenza due to unidentified influenza virus with other respiratory manifestations: Secondary | ICD-10-CM | POA: Insufficient documentation

## 2018-07-02 DIAGNOSIS — Z79899 Other long term (current) drug therapy: Secondary | ICD-10-CM | POA: Insufficient documentation

## 2018-07-02 DIAGNOSIS — R69 Illness, unspecified: Secondary | ICD-10-CM

## 2018-07-02 DIAGNOSIS — F17210 Nicotine dependence, cigarettes, uncomplicated: Secondary | ICD-10-CM | POA: Insufficient documentation

## 2018-07-02 MED ORDER — ONDANSETRON 4 MG PO TBDP
4.0000 mg | ORAL_TABLET | Freq: Once | ORAL | Status: AC
  Administered 2018-07-02: 4 mg via ORAL
  Filled 2018-07-02: qty 1

## 2018-07-02 MED ORDER — ACETAMINOPHEN 325 MG PO TABS: 650.0000 mg | ORAL_TABLET | Freq: Once | ORAL | Status: DC | PRN

## 2018-07-02 MED ORDER — IBUPROFEN 800 MG PO TABS
800.0000 mg | ORAL_TABLET | Freq: Once | ORAL | Status: AC
Start: 2018-07-02 — End: 2018-07-02
  Administered 2018-07-02: 800 mg via ORAL
  Filled 2018-07-02: qty 1

## 2018-07-02 MED ORDER — ONDANSETRON 4 MG PO TBDP: 4.0000 mg | ORAL_TABLET | Freq: Four times a day (QID) | ORAL | 0 refills | Status: DC | PRN

## 2018-07-02 NOTE — Discharge Instructions (Signed)
You may alternate Tylenol 1000 mg every 6 hours as needed for fever and pain and ibuprofen 800 mg every 8 hours as needed for fever and pain. Please rest and drink plenty of fluids. This is a viral illness causing your symptoms. You do not need antibiotics for a virus. You may use over-the-counter nasal saline spray and Afrin nasal saline spray as needed for nasal congestion. Please do not use Afrin for more than 3 days in a row. You may use guaifenesin and dextromethorphan as needed for cough.  You may use lozenges and Chloraseptic spray to help with sore throat.  Warm salt water gargles can also help with sore throat.  You may use over-the-counter Unisom (doxyalamine) or Benadryl to help with sleep.  Please note that some combination medicines such as DayQuil and NyQuil have multiple medications in them.  Please make sure you look at all labels to ensure that you are not taking too much of any one particular medication.  Symptoms from a virus may take 7-14 days to run its course.  We do not test for the flu from the emergency department as it takes hours for this test to come back and it would not change our management. The flu is treated like any other virus with supportive measures as listed above. At this time you are outside the treatment window for Tamiflu. Tamiflu has to be taken within the first 48 hours of symptoms.  Tamiflu has many side effects including nausea, vomiting and diarrhea.

## 2018-07-02 NOTE — ED Triage Notes (Signed)
Pt c/o fever, cough & generalized body aches since Friday.  Pt stated "I haven't eaten anything really since Friday.  I've had excessive phlegm."  Pt denies n/v.

## 2018-07-02 NOTE — ED Provider Notes (Signed)
TIME SEEN: 3:21 AM  CHIEF COMPLAINT: Flulike symptoms  HPI: Patient is a 48 year old female with history of depression and anxiety who presents to the emergency department with flulike symptoms for the past 4 days.  Reports fevers, nonproductive cough, body aches, nausea and vomiting.  No diarrhea.  Did not have a flu shot this year.  No known sick contacts or recent travel.  No sore throat, ear pain.  She is status post hysterectomy.  ROS: See HPI Constitutional:  fever  Eyes: no drainage  ENT: no runny nose   Cardiovascular:  no chest pain  Resp: no SOB  GI: vomiting GU: no dysuria Integumentary: no rash  Allergy: no hives  Musculoskeletal: no leg swelling  Neurological: no slurred speech ROS otherwise negative  PAST MEDICAL HISTORY/PAST SURGICAL HISTORY:  Past Medical History:  Diagnosis Date  . Anemia   . Anxiety    no meds  . Depression    no meds  . SVD (spontaneous vaginal delivery)    x 4    MEDICATIONS:  Prior to Admission medications   Medication Sig Start Date End Date Taking? Authorizing Provider  cephALEXin (KEFLEX) 500 MG capsule Take 1 capsule (500 mg total) by mouth 2 (two) times daily. 06/20/17   Vanessa Kick, MD  GARLIC PO Take 1 capsule by mouth daily.    [provider]  IRON PO Take 1 tablet by mouth 2 (two) times a week.     [provider]  Multiple Vitamin (MULTIVITAMIN WITH MINERALS) TABS Take 1 tablet by mouth every morning.    [provider]    ALLERGIES:  No Known Allergies  SOCIAL HISTORY:  Social History   Tobacco Use  . Smoking status: Current Some Day Smoker    Packs/day: 0.15    Types: Cigarettes  . Smokeless tobacco: Never Used  Substance Use Topics  . Alcohol use: Yes    Comment: social    FAMILY HISTORY: Family History  Problem Relation Age of Onset  . Diabetes Mother   . Diabetes Other   . Cancer Other     EXAM: BP 122/72 (BP Location: Left Arm)   Pulse (!) 114   Temp 99.7 F (37.6 C)  (Oral)   Resp 17   Ht 5\' 7"  (1.702 m)   LMP 04/26/2013   SpO2 100%   BMI 22.22 kg/m  CONSTITUTIONAL: Alert and oriented and responds appropriately to questions. Well-appearing; well-nourished, nontoxic, does not appear septic HEAD: Normocephalic EYES: Conjunctivae clear, pupils appear equal, EOMI ENT: normal nose; moist mucous membranes; No pharyngeal erythema or petechiae, no tonsillar hypertrophy or exudate, no uvular deviation, no unilateral swelling, no trismus or drooling, no muffled voice, normal phonation, no stridor, no dental caries present, no drainable dental abscess noted, no Ludwig's angina, tongue sits flat in the bottom of the mouth, no angioedema, no facial erythema or warmth, no facial swelling; no pain with movement of the neck. NECK: Supple, no meningismus, no nuchal rigidity, no LAD  CARD: Regular and tachycardic; S1 and S2 appreciated; no murmurs, no clicks, no rubs, no gallops RESP: Normal chest excursion without splinting or tachypnea; breath sounds clear and equal bilaterally; no wheezes, no rhonchi, no rales, no hypoxia or respiratory distress, speaking full sentences ABD/GI: Normal bowel sounds; non-distended; soft, non-tender, no rebound, no guarding, no peritoneal signs, no hepatosplenomegaly BACK:  The back appears normal and is non-tender to palpation, there is no CVA tenderness EXT: Normal ROM in all joints; non-tender to palpation; no edema;  normal capillary refill; no cyanosis, no calf tenderness or swelling    SKIN: Normal color for age and race; warm; no rash NEURO: Moves all extremities equally PSYCH: The patient's mood and manner are appropriate. Grooming and personal hygiene are appropriate.  MEDICAL DECISION MAKING: Patient here with flulike symptoms.  Outside treatment window for Tamiflu or Xofluza.  Abdominal exam benign.  Initially tachycardic here but this has improved after ibuprofen, Zofran.  She is tolerating oral fluids.  Doubt meningitis,  pneumonia, bacteremia, sepsis.  Doubt appendicitis, colitis, bowel obstruction, UTI.  I feel patient is safe to be discharged home with supportive care instructions.  We discussed return precautions.  Will discharge with prescription of Zofran.  She verbalized understanding.  At this time, I do not feel there is any life-threatening condition present. I have reviewed and discussed all results (EKG, imaging, lab, urine as appropriate) and exam findings with patient/family. I have reviewed nursing notes and appropriate previous records.  I feel the patient is safe to be discharged home without further emergent workup and can continue workup as an outpatient as needed. Discussed usual and customary return precautions. Patient/family verbalize understanding and are comfortable with this plan.  Outpatient follow-up has been provided as needed. All questions have been answered.     Monti Jilek, Delice Bison, DO 07/02/18 [phone]

## 2021-07-02 ENCOUNTER — Other Ambulatory Visit: Payer: Self-pay

## 2021-07-02 ENCOUNTER — Encounter (HOSPITAL_COMMUNITY): Payer: Self-pay

## 2021-07-02 ENCOUNTER — Emergency Department (HOSPITAL_COMMUNITY)
Admission: EM | Admit: 2021-07-02 | Discharge: 2021-07-02 | Disposition: A | Discharge status: Home / Self Care | Payer: Self-pay | Attending: Emergency Medicine | Admitting: Emergency Medicine

## 2021-07-02 ENCOUNTER — Emergency Department (HOSPITAL_COMMUNITY): Payer: Self-pay

## 2021-07-02 DIAGNOSIS — W010XXA Fall on same level from slipping, tripping and stumbling without subsequent striking against object, initial encounter: Secondary | ICD-10-CM | POA: Insufficient documentation

## 2021-07-02 DIAGNOSIS — Y9389 Activity, other specified: Secondary | ICD-10-CM | POA: Insufficient documentation

## 2021-07-02 DIAGNOSIS — S4991XA Unspecified injury of right shoulder and upper arm, initial encounter: Secondary | ICD-10-CM | POA: Insufficient documentation

## 2021-07-02 DIAGNOSIS — M7021 Olecranon bursitis, right elbow: Secondary | ICD-10-CM

## 2021-07-02 NOTE — Discharge Instructions (Signed)
Your x-rays were negative for fracture or dislocation.  It does look like you have some inflammation of the bursa in your elbow joint.  This is kind of like a padding that prevents your bones from rubbing on each other.  Fortunately this should resolve on its own with some time.  To help promote faster healing, ice the elbow as often as you can and take NSAIDs such as Aleve or ibuprofen to help with inflammation.  I would also avoid resting the elbow directly onto hard surfaces.  If pain does not improve over the next couple of months, please follow-up with your primary care doctor.

## 2021-07-02 NOTE — ED Provider Notes (Signed)
[facility] DEPT Provider Note   CSN: [id number] Arrival date & time: 07/02/21  8270     History  Chief Complaint  Patient presents with   Fall   Arm Injury    BUFFI EWTON is a 51 y.o. female who presents to the ED for evaluation of right elbow and shoulder injury that occurred just prior to arrival.  Patient states that she woke up to get some water from downstairs when the hem of her yoga pants caught under her heel causing her to slip backwards on the stairs.  Patient caught herself with her right elbow.  She has significant pain rated 8 out of 10 that is slightly improved with ice and rest.  She does have some mild tingling of the right fingertips.  She has some pain in the shoulder as well although she is unable to tell if this is radiation from her elbow injury or separate shoulder injury.  She has no other complaints   Fall  Arm Injury     Home Medications Prior to Admission medications   Medication Sig Start Date End Date Taking? Authorizing Provider  cephALEXin (KEFLEX) 500 MG capsule Take 1 capsule (500 mg total) by mouth 2 (two) times daily. 06/20/17   Vanessa Kick, MD  GARLIC PO Take 1 capsule by mouth daily.    [provider]  IRON PO Take 1 tablet by mouth 2 (two) times a week.     [provider]  Multiple Vitamin (MULTIVITAMIN WITH MINERALS) TABS Take 1 tablet by mouth every morning.    [provider]  ondansetron (ZOFRAN ODT) 4 MG disintegrating tablet Take 1 tablet (4 mg total) by mouth every 6 (six) hours as needed for nausea or vomiting. 07/02/18   Ward, Delice Bison, DO      Allergies    Patient has no known allergies.    Review of Systems   Review of Systems  Physical Exam Updated Vital Signs BP 134/86    Pulse 88    Temp 97.7 F (36.5 C) (Oral)    Resp 18    Ht 5\' 7"  (1.702 m)    Wt 72.6 kg    LMP 04/26/2013    SpO2 100%    BMI 25.06 kg/m  Physical Exam Vitals and nursing note reviewed.   Constitutional:      General: She is not in acute distress.    Appearance: She is not ill-appearing.  HENT:     Head: Atraumatic.  Eyes:     Conjunctiva/sclera: Conjunctivae normal.  Cardiovascular:     Rate and Rhythm: Normal rate and regular rhythm.     Pulses: Normal pulses.     Heart sounds: No murmur heard. Pulmonary:     Effort: Pulmonary effort is normal. No respiratory distress.     Breath sounds: Normal breath sounds.  Abdominal:     General: Abdomen is flat. There is no distension.     Palpations: Abdomen is soft.     Tenderness: There is no abdominal tenderness.  Musculoskeletal:     Cervical back: Normal range of motion.     Comments: Swelling and tenderness to the posterior elbow near the olecranon.  ROM limited due to pain.  Shoulder without palpable deformities, crepitus.  ROM intact although slightly uncomfortable due to pain.  2+ radial pulses.  Sensation intact  Skin:    General: Skin is warm and dry.     Capillary Refill: Capillary refill takes less than  2 seconds.  Neurological:     General: No focal deficit present.     Mental Status: She is alert.  Psychiatric:        Mood and Affect: Mood normal.    ED Results / Procedures / Treatments   Labs (all labs ordered are listed, but only abnormal results are displayed) Labs Reviewed - No data to display  EKG None  Radiology DG Shoulder Right  Result Date: 07/02/2021 CLINICAL DATA:  Fall injury with right shoulder and right elbow. EXAM: RIGHT ELBOW - COMPLETE 3+ VIEW; RIGHT SHOULDER - 2+ VIEW COMPARISON:  None. FINDINGS: Right shoulder: There is normal bone mineralization. There is no evidence of fracture, dislocation or degenerative changes. Surrounding soft tissues are unremarkable. The right upper lung field is clear. Right elbow: There is moderate dorsal soft tissue swelling over the olecranon which could be an olecranon bursal effusion or swelling in the overlying tissues. There is no evidence of  fracture, dislocation or joint effusion. Spurring of the coronoid process of the ulna is present anteriorly but no other findings of arthrosis. There is mild enthesopathic spurring of the lateral and medial humeral epicondyles but no olecranon enthesopathic spurs. No radiopaque foreign body. IMPRESSION: 1. No evidence of fractures of the right elbow and right shoulder. 2. Dorsal swelling at the elbow which could be in the olecranon bursa or the overlying soft tissues. 3. Early degenerative and enthesopathic changes of the right elbow. Electronically Signed   By: Telford Nab M.D.   On: 07/02/2021 04:36   DG Elbow Complete Right  Result Date: 07/02/2021 CLINICAL DATA:  Fall injury with right shoulder and right elbow. EXAM: RIGHT ELBOW - COMPLETE 3+ VIEW; RIGHT SHOULDER - 2+ VIEW COMPARISON:  None. FINDINGS: Right shoulder: There is normal bone mineralization. There is no evidence of fracture, dislocation or degenerative changes. Surrounding soft tissues are unremarkable. The right upper lung field is clear. Right elbow: There is moderate dorsal soft tissue swelling over the olecranon which could be an olecranon bursal effusion or swelling in the overlying tissues. There is no evidence of fracture, dislocation or joint effusion. Spurring of the coronoid process of the ulna is present anteriorly but no other findings of arthrosis. There is mild enthesopathic spurring of the lateral and medial humeral epicondyles but no olecranon enthesopathic spurs. No radiopaque foreign body. IMPRESSION: 1. No evidence of fractures of the right elbow and right shoulder. 2. Dorsal swelling at the elbow which could be in the olecranon bursa or the overlying soft tissues. 3. Early degenerative and enthesopathic changes of the right elbow. Electronically Signed   By: Telford Nab M.D.   On: 07/02/2021 04:36    Procedures Procedures    Medications Ordered in ED Medications - No data to display  ED Course/ Medical Decision  Making/ A&P Clinical Course as of 07/02/21 0506  Sat Jul 02, 2021  0439 DG Shoulder Right [EC]    Clinical Course User Index [EC] Tonye Pearson, PA-C                           Medical Decision Making Amount and/or Complexity of Data Reviewed Radiology: ordered. Decision-making details documented in ED Course.   History:  Per HPI Social determinants of health: None  Initial impression:  This patient presents to the ED for concern of arm injury, this involves an extensive number of treatment options, and is a complaint that carries with it a high risk  of complications and morbidity.    Patient is well-appearing 51 year old female in no acute distress, nontoxic-appearing.  No palpable deformities of the elbow or right shoulder although there is significant swelling to the posterior elbow.  Will obtain x-ray of the elbow and shoulder.  Patient with ice pack applied to elbow.  Declines pain medication at this time.   Imaging Studies ordered:  I ordered imaging studies including  X-ray right elbow without acute fracture although notable for swelling of olecranon bursa X-ray right shoulder without acute fracture or dislocation I independently visualized and interpreted imaging and I agree with the radiologist interpretation.    Disposition:  After consideration of the diagnostic results, physical exam, history and the patients response to treatment feel that the patent would benefit from discharge with outpatient follow-up.   Olecranon bursitis of right elbow: Conservative measures indicated.  We discussed pros and cons of using a sling.  Advised that this may provide some comfort for her shoulder pain, however it may aggravate the bursitis.  Advised NSAIDs, frequent ice packs and to avoid resting elbow on hard surfaces.  All questions were asked and answered.  Patient was discharged home in good condition.   Final Clinical Impression(s) / ED Diagnoses Final diagnoses:  Olecranon  bursitis of right elbow    Rx / DC Orders ED Discharge Orders     None         Rodena Piety 07/02/21 5615    Quintella Reichert, MD 07/02/21 2250

## 2021-07-02 NOTE — ED Triage Notes (Signed)
Patient woke up to get some water. She fell onto her right elbow. Did not hit her head. Swelling noted. Patient able to feel her fingers and move them.

## 2022-05-05 ENCOUNTER — Ambulatory Visit (HOSPITAL_COMMUNITY)
Admission: EM | Admit: 2022-05-05 | Discharge: 2022-05-05 | Disposition: A | Discharge status: Home / Self Care | Payer: 59 | Attending: Emergency Medicine | Admitting: Emergency Medicine

## 2022-05-05 ENCOUNTER — Encounter (HOSPITAL_COMMUNITY): Payer: Self-pay

## 2022-05-05 DIAGNOSIS — J020 Streptococcal pharyngitis: Secondary | ICD-10-CM | POA: Diagnosis not present

## 2022-05-05 LAB — POC INFLUENZA A AND B ANTIGEN (URGENT CARE ONLY)
INFLUENZA A ANTIGEN, POC: NEGATIVE
INFLUENZA B ANTIGEN, POC: NEGATIVE

## 2022-05-05 LAB — POCT RAPID STREP A, ED / UC
Rapid Strep A Screen: POSITIVE — AB
Streptococcus, Group A Screen (Direct): POSITIVE — AB

## 2022-05-05 MED ORDER — AMOXICILLIN 500 MG PO CAPS: 500.0000 mg | ORAL_CAPSULE | Freq: Two times a day (BID) | ORAL | 0 refills | Status: DC

## 2022-05-05 MED ORDER — ACETAMINOPHEN 325 MG PO TABS: 975.0000 mg | ORAL_TABLET | Freq: Once | ORAL | Status: DC

## 2022-05-05 MED ORDER — IBUPROFEN 800 MG PO TABS
ORAL_TABLET | ORAL | Status: AC
  Filled 2022-05-05: qty 1

## 2022-05-05 MED ORDER — IBUPROFEN 800 MG PO TABS
800.0000 mg | ORAL_TABLET | Freq: Once | ORAL | Status: AC
  Administered 2022-05-05: 800 mg via ORAL

## 2022-05-05 NOTE — Discharge Instructions (Addendum)
Amoxicillin has been sent to the pharmacy, you will take this 2 times daily for the next 10 days.  Please make sure to finish the whole antibiotic even if symptoms improve.   Using over-the-counter medications can help treat the symptoms.  You may rotate Tylenol and ibuprofen every 4-6 hours.  For example, take Tylenol now and then 4 to 6 hours later take ibuprofen.  You can use Tylenol for fever and moderate pain, you can take this medication every 4-6 hours, please do not take more than 3000 mg in a 24-hour day.  You can take ibuprofen every 6 hours, do not take more than 2400 mg in a 24-hour day.  I advised that you do not take ibuprofen on an empty stomach, ibuprofen can cause GI problems such as GI bleeding.  Dayquil and Nyquil has Tylenol present in it's formulation, you don't need to take any extra Tylenol while taking these medications.  Maintaining hydration status is very important, please drink at least 8 cups of water daily.  Please try to intake nutrient dense meals.   Please follow up if symptoms are not improving.

## 2022-05-05 NOTE — ED Triage Notes (Signed)
Pt is here for fatigue, body aches chills, sore throat and back pain x2days

## 2022-05-05 NOTE — ED Provider Notes (Signed)
[facility]    CSN: [id number] Arrival date & time: 05/05/22  1015      History   Chief Complaint No chief complaint on file.   HPI Megan Wall is a 51 y.o. female.  Patient presents complaining of sore throat, generalized bodyaches, fatigue, and chills that started 2 days ago.  Patient reports that she has had an exposure to an illness of unknown etiology.  Patient reports that her symptoms have progressively worsened.  Patient states that she has taken DayQuil today with some relief of symptoms.  Patient has 2 family members at bedside.  She reports a history of anemia.  HPI  Past Medical History:  Diagnosis Date   Anemia    Anxiety    no meds   Depression    no meds   SVD (spontaneous vaginal delivery)    x 4    Patient Active Problem List   Diagnosis Date Noted   Postoperative state 05/12/2013   Acute abdominal pain 12/21/2012   Fibroid 12/21/2012   VAGINITIS 05/20/2007    Past Surgical History:  Procedure Laterality Date   APPENDECTOMY     goiter surgery     LAPAROSCOPIC APPENDECTOMY N/A 12/21/2012   Procedure: APPENDECTOMY LAPAROSCOPIC;  Surgeon: Shann Medal, MD;  Location: [facility];  Service: General;  Laterality: N/A;   LAPAROSCOPIC ASSISTED VAGINAL HYSTERECTOMY Bilateral 05/12/2013   Procedure: LAPAROSCOPIC ASSISTED VAGINAL HYSTERECTOMY WITH BILATERAL SALPINGECTOMY;  Surgeon: Lavonia Drafts, MD;  Location: [facility];  Service: Gynecology;  Laterality: Bilateral;    OB History     Gravida  4   Para  4   Term  4   Preterm      AB      Living         SAB      IAB      Ectopic      Multiple      Live Births               Home Medications    Prior to Admission medications   Medication Sig Start Date End Date Taking? Authorizing Provider  amoxicillin (AMOXIL) 500 MG capsule Take 1 capsule (500 mg total) by mouth 2 (two) times daily. 05/05/22  Yes Flossie Dibble, NP  GARLIC PO Take 1 capsule by mouth daily.     [provider]  IRON PO Take 1 tablet by mouth 2 (two) times a week.     [provider]  Multiple Vitamin (MULTIVITAMIN WITH MINERALS) TABS Take 1 tablet by mouth every morning.    [provider]    Family History Family History  Problem Relation Age of Onset   Diabetes Mother    Diabetes Other    Cancer Other     Social History Social History   Tobacco Use   Smoking status: Some Days    Packs/day: 0.15    Types: Cigarettes   Smokeless tobacco: Never  Substance Use Topics   Alcohol use: Yes    Comment: social   Drug use: No     Allergies   Patient has no known allergies.   Review of Systems Review of Systems  Constitutional:  Positive for activity change, appetite change, chills, fatigue and fever.  HENT:  Positive for sore throat. Negative for congestion, ear discharge, ear pain, postnasal drip, rhinorrhea, sinus pressure, sinus pain and trouble swallowing.   Eyes: Negative.   Respiratory:  Negative for cough, chest tightness, shortness of  breath, wheezing and stridor.   Cardiovascular:  Negative for chest pain and palpitations.  Gastrointestinal:  Negative for abdominal pain, diarrhea, nausea and vomiting.  Musculoskeletal:  Positive for myalgias.     Physical Exam Triage Vital Signs ED Triage Vitals [05/05/22 1250]  Enc Vitals Group     BP 117/82     Pulse Rate (!) 133     Resp 16     Temp (!) 102.2 F (39 C)     Temp Source Oral     SpO2 95 %     Weight      Height      Head Circumference      Peak Flow      Pain Score 10     Pain Loc      Pain Edu?      Excl. in [place]?    No data found.  Updated Vital Signs BP 117/82 (BP Location: Left Arm)   Pulse (!) 133   Temp (!) 102.2 F (39 C) (Oral)   Resp 16   LMP 04/26/2013   SpO2 95%      Physical Exam Vitals and nursing note reviewed.  Constitutional:      Appearance: Normal appearance. She is ill-appearing.  HENT:     Right Ear: Hearing, tympanic membrane,  ear canal and external ear normal.     Left Ear: Hearing, tympanic membrane, ear canal and external ear normal.     Nose: Rhinorrhea present. No congestion. Rhinorrhea is clear.     Right Turbinates: Not enlarged, swollen or pale.     Left Turbinates: Not enlarged, swollen or pale.     Right Sinus: No maxillary sinus tenderness or frontal sinus tenderness.     Left Sinus: No maxillary sinus tenderness or frontal sinus tenderness.     Mouth/Throat:     Mouth: Mucous membranes are moist.     Pharynx: Uvula midline. Pharyngeal swelling and posterior oropharyngeal erythema present. No oropharyngeal exudate or uvula swelling.     Tonsils: No tonsillar exudate or tonsillar abscesses. 1+ on the right. 1+ on the left.  Cardiovascular:     Rate and Rhythm: Regular rhythm. Tachycardia present.     Heart sounds: Normal heart sounds.  Pulmonary:     Effort: Pulmonary effort is normal.     Breath sounds: Normal air entry. No decreased breath sounds, wheezing, rhonchi or rales.  Lymphadenopathy:     Head:     Right side of head: Tonsillar adenopathy present.     Left side of head: Tonsillar adenopathy present.     Cervical: No cervical adenopathy.     Right cervical: No superficial, deep or posterior cervical adenopathy.    Left cervical: No superficial, deep or posterior cervical adenopathy.  Neurological:     Mental Status: She is alert.      UC Treatments / Results  Labs (all labs ordered are listed, but only abnormal results are displayed) Labs Reviewed  POCT RAPID STREP A, ED / UC - Abnormal; Notable for the following components:      Result Value   Rapid Strep A Screen Positive (*)    All other components within normal limits  POCT RAPID STREP A, ED / UC - Abnormal; Notable for the following components:   Streptococcus, Group A Screen (Direct) POSITIVE (*)    All other components within normal limits  POC INFLUENZA A AND B ANTIGEN (URGENT CARE ONLY)    EKG   Radiology No  results found.  Procedures Procedures (including critical care time)  Medications Ordered in UC Medications  ibuprofen (ADVIL) tablet 800 mg (800 mg Oral Given 05/05/22 1339)    Initial Impression / Assessment and Plan / UC Course  I have reviewed the triage vital signs and the nursing notes.  Pertinent labs & imaging results that were available during my care of the patient were reviewed by me and considered in my medical decision making (see chart for details).     Patient evaluated for step pharyngitis. POC strep was positive.  POC influenza was negative.  Unable to given Tylenol due to low supply, this writer chose ibuprofen as an alternative regimen, Ibuprofen 800 mg was given in office.  Amoxicillin prescribed and she was made aware of treatment regiment.  Patient was made aware of timeline for resolution of symptoms and when follow-up would be necessary.  Patient verbalized understanding of instructions.  Charting was provided using a a verbal dictation system, charting was proofread for errors, errors may occur which could change the meaning of the information charted.    Final Clinical Impressions(s) / UC Diagnoses   Final diagnoses:  Strep pharyngitis     Discharge Instructions      Amoxicillin has been sent to the pharmacy, you will take this 2 times daily for the next 10 days.  Please make sure to finish the whole antibiotic even if symptoms improve.   Using over-the-counter medications can help treat the symptoms.  You may rotate Tylenol and ibuprofen every 4-6 hours.  For example, take Tylenol now and then 4 to 6 hours later take ibuprofen.  You can use Tylenol for fever and moderate pain, you can take this medication every 4-6 hours, please do not take more than 3000 mg in a 24-hour day.  You can take ibuprofen every 6 hours, do not take more than 2400 mg in a 24-hour day.  I advised that you do not take ibuprofen on an empty stomach, ibuprofen can cause GI problems  such as GI bleeding.  Dayquil and Nyquil has Tylenol present in it's formulation, you don't need to take any extra Tylenol while taking these medications.  Maintaining hydration status is very important, please drink at least 8 cups of water daily.  Please try to intake nutrient dense meals.   Please follow up if symptoms are not improving.      ED Prescriptions     Medication Sig Dispense Auth. Provider   amoxicillin (AMOXIL) 500 MG capsule Take 1 capsule (500 mg total) by mouth 2 (two) times daily. 20 capsule Flossie Dibble, NP      PDMP not reviewed this encounter.   Flossie Dibble, NP 05/05/22 1422

## 2022-05-15 ENCOUNTER — Encounter (HOSPITAL_COMMUNITY): Payer: Self-pay

## 2022-05-15 ENCOUNTER — Other Ambulatory Visit: Payer: Self-pay

## 2022-05-15 ENCOUNTER — Emergency Department (HOSPITAL_COMMUNITY)
Admission: EM | Admit: 2022-05-15 | Discharge: 2022-05-15 | Disposition: A | Discharge status: Home / Self Care | Payer: 59 | Attending: Emergency Medicine | Admitting: Emergency Medicine

## 2022-05-15 ENCOUNTER — Emergency Department (HOSPITAL_COMMUNITY): Payer: 59

## 2022-05-15 DIAGNOSIS — M65351 Trigger finger, right little finger: Secondary | ICD-10-CM | POA: Insufficient documentation

## 2022-05-15 MED ORDER — ACETAMINOPHEN 325 MG PO TABS: 650.0000 mg | ORAL_TABLET | Freq: Once | ORAL | Status: DC

## 2022-05-15 MED ORDER — PREDNISONE 20 MG PO TABS
60.0000 mg | ORAL_TABLET | Freq: Once | ORAL | Status: AC
  Administered 2022-05-15: 60 mg via ORAL
  Filled 2022-05-15: qty 3

## 2022-05-15 MED ORDER — PREDNISONE 10 MG PO TABS: 20.0000 mg | ORAL_TABLET | Freq: Every day | ORAL | 0 refills | Status: AC

## 2022-05-15 NOTE — ED Provider Triage Note (Signed)
Emergency Medicine Provider Triage Evaluation Note  Megan Wall , a 52 y.o. female  was evaluated in triage.  Pt complains of right hand pain.  Started few days ago after falling asleep on it.  Now it feels like a burning/tingling sensation worse to the second and third digit movements of the hand.  Has not had pain on the pinky side as well.  Unable to make a fist secondary to pain.  No injury other than falling asleep on it.  No history of diabetes..  Review of Systems  Per HPI  Physical Exam  BP (!) 153/72 (BP Location: Left Arm)   Pulse 98   Temp 98.8 F (37.1 C) (Oral)   Resp 16   Ht '5\' 7"'$  (1.702 m)   Wt 68 kg   LMP 04/26/2013   SpO2 97%   BMI 23.49 kg/m  Gen:   Awake, no distress   Resp:  Normal effort  MSK:   Moves extremities without difficulty  Other:  Cap refill less than 2, sensation is intact.  Radial pulse 2+.  Reproducible tenderness generalized over the dorsum of the hand and wrist.  Medical Decision Making  Medically screening exam initiated at 4:26 PM.  Appropriate orders placed.  Megan Wall was informed that the remainder of the evaluation will be completed by another provider, this initial triage assessment does not replace that evaluation, and the importance of remaining in the ED until their evaluation is complete.    Sherrill Raring, PA-C 05/15/22 1627

## 2022-05-15 NOTE — Discharge Instructions (Signed)
You are seen in the emergency department for a trigger finger.  Based on her presentation this most likely be helped by short course of prednisone they will take for 5 days.  You may also take anti-inflammatory such as ibuprofen or naproxen as needed for pain.  You also be sent a referral to contact a hand specialist for further evaluation if needed if symptoms or not improving over the course of the week.

## 2022-05-15 NOTE — ED Provider Notes (Signed)
[facility] DEPT Provider Note   CSN: [id number] Arrival date & time: 05/15/22  1535     History Chief Complaint  Patient presents with   Hand Pain    Megan Wall is a 52 y.o. female.   Hand Pain Pertinent negatives include no chest pain and no shortness of breath.  Patient presented to the emergency department for right hand pain. She reports noticing some swelling along the 5th digit of right hand without numbness or weakness. 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 Denies weakness, loss of motor function, chest pain, or shortness of breath.    Home Medications Prior to Admission medications   Medication Sig Start Date End Date Taking? Authorizing Provider  predniSONE (DELTASONE) 10 MG tablet Take 2 tablets (20 mg total) by mouth daily for 5 days. 05/15/22 05/20/22 Yes Luvenia Heller, PA-C  amoxicillin (AMOXIL) 500 MG capsule Take 1 capsule (500 mg total) by mouth 2 (two) times daily. 05/05/22   Flossie Dibble, NP  GARLIC PO Take 1 capsule by mouth daily.    [provider]  IRON PO Take 1 tablet by mouth 2 (two) times a week.     [provider]  Multiple Vitamin (MULTIVITAMIN WITH MINERALS) TABS Take 1 tablet by mouth every morning.    [provider]      Allergies    Patient has no known allergies.    Review of Systems   Review of Systems  Constitutional:  Negative for chills and fever.  Respiratory:  Negative for cough, chest tightness and shortness of breath.   Cardiovascular:  Negative for chest pain.  Musculoskeletal:  Positive for joint swelling.  Neurological:  Negative for weakness and numbness.  All other systems reviewed and are negative.   Physical Exam Updated Vital Signs BP (!) 144/74 (BP Location: Left Arm)   Pulse 90   Temp 98 F (36.7 C) (Oral)   Resp 18   Ht '5\' 7"'$  (1.702 m)   Wt 68 kg   LMP 04/26/2013   SpO2 100%   BMI 23.49 kg/m  Physical Exam Vitals and  nursing note reviewed.  Constitutional:      Appearance: Normal appearance.  HENT:     Head: Normocephalic and atraumatic.  Eyes:     Conjunctiva/sclera: Conjunctivae normal.  Cardiovascular:     Rate and Rhythm: Normal rate and regular rhythm.  Pulmonary:     Effort: Pulmonary effort is normal.     Breath sounds: Normal breath sounds.  Musculoskeletal:        General: Swelling and tenderness present.     Comments: Right 5th digit has some swelling but no erythema. Pain on palpation but normal ROM limited due to pain.  Skin:    General: Skin is warm and dry.     Capillary Refill: Capillary refill takes less than 2 seconds.  Neurological:     General: No focal deficit present.     Mental Status: She is alert.  Psychiatric:        Mood and Affect: Mood normal.     ED Results / Procedures / Treatments   Labs (all labs ordered are listed, but only abnormal results are displayed) Labs Reviewed - No data to display  EKG None  Radiology DG Hand Complete Right  Result Date: 05/15/2022 CLINICAL DATA:  Several day history of right hand burning/tingling, worse in the second and third digits. EXAM: RIGHT HAND - COMPLETE 3 VIEW  COMPARISON:  None Available. FINDINGS: There is no evidence of fracture or dislocation. There is no evidence of arthropathy or other focal bone abnormality. Soft tissues are unremarkable. IMPRESSION: Normal radiographs of the right hand Electronically Signed   By: Darrin Nipper M.D.   On: 05/15/2022 16:42    Procedures Procedures   Medications Ordered in ED Medications  acetaminophen (TYLENOL) tablet 650 mg (650 mg Oral Patient Refused/Not Given 05/15/22 2026)  predniSONE (DELTASONE) tablet 60 mg (60 mg Oral Given 05/15/22 2107)    ED Course/ Medical Decision Making/ A&P                           Medical Decision Making Risk Prescription drug management.   This patient presents to the ED for concern of hand pain. Differential diagnosis includes tenosynovitis,  cellulitis, trigger finger    Imaging Studies ordered:  I ordered imaging studies including right hand xray I independently visualized and interpreted imaging which showed normal hand xray without acute fractures, dislocations I agree with the radiologist interpretation   Medicines ordered and prescription drug management:  I ordered medication including prednisone  for swelling  Reevaluation of the patient after these medicines showed that the patient improved I have reviewed the patients home medicines and have made adjustments as needed   Problem List / ED Course:  Patient presented to the ED with complaints of right hand pain. Patient noted some swelling over the right pinky without erythema or obvious fluid collection. Patient's xray was negative and physical exam was largely reassuring as there was no obvious signs of infection or sources or infection. 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 A dose prednisone was given to patient with a prednisone burst given for the next 5 days. Advised patient that she should follow up with a hand specialist for further evaluation as needed if symptoms do not improve over the next few days. Patient was agreeable with plan to discharge home and verbalized understanding return precautions. Patient did not have any questions at the end of this encounter.   Final Clinical Impression(s) / ED Diagnoses Final diagnoses:  Trigger little finger of right hand    Rx / DC Orders ED Discharge Orders          Ordered    predniSONE (DELTASONE) 10 MG tablet  Daily        05/15/22 2108              Luvenia Heller, PA-C 05/15/22 2245    Drenda Freeze, MD 05/15/22 [phone]

## 2022-05-15 NOTE — ED Triage Notes (Signed)
Patient states she had tingling of her right fingers 2 days ago. Patient now has right hand pain and swelling from the right hand to he elbow. Patient states very painful to move her hand  Patient denies any injury.

## 2022-05-15 NOTE — Progress Notes (Signed)
Orthopedic Tech Progress Note Patient Details:  Megan Wall 1971-05-01 [id number]  Ortho Devices Type of Ortho Device: Velcro wrist forearm splint Ortho Device/Splint Location: RUE Ortho Device/Splint Interventions: Ordered, Application, Adjustment   Post Interventions Patient Tolerated: Well Instructions Provided: Care of device, Adjustment of device  [place] [facility] 05/15/2022, 9:18 PM

## 2022-10-29 DIAGNOSIS — S31815A Open bite of right buttock, initial encounter: Secondary | ICD-10-CM | POA: Diagnosis not present

## 2022-10-29 DIAGNOSIS — R238 Other skin changes: Secondary | ICD-10-CM | POA: Diagnosis not present

## 2022-10-29 DIAGNOSIS — W57XXXA Bitten or stung by nonvenomous insect and other nonvenomous arthropods, initial encounter: Secondary | ICD-10-CM | POA: Diagnosis not present

## 2022-10-29 DIAGNOSIS — R229 Localized swelling, mass and lump, unspecified: Secondary | ICD-10-CM | POA: Diagnosis not present

## 2022-10-29 DIAGNOSIS — L03317 Cellulitis of buttock: Secondary | ICD-10-CM | POA: Diagnosis not present

## 2022-12-10 DIAGNOSIS — R000 Tachycardia, unspecified: Secondary | ICD-10-CM | POA: Diagnosis not present

## 2022-12-10 DIAGNOSIS — R0602 Shortness of breath: Secondary | ICD-10-CM | POA: Diagnosis not present

## 2022-12-10 DIAGNOSIS — R002 Palpitations: Secondary | ICD-10-CM | POA: Diagnosis not present

## 2022-12-10 DIAGNOSIS — F419 Anxiety disorder, unspecified: Secondary | ICD-10-CM | POA: Diagnosis not present

## 2022-12-10 DIAGNOSIS — F410 Panic disorder [episodic paroxysmal anxiety] without agoraphobia: Secondary | ICD-10-CM | POA: Diagnosis not present

## 2023-01-30 ENCOUNTER — Ambulatory Visit
Admission: EM | Admit: 2023-01-30 | Discharge: 2023-01-30 | Disposition: A | Discharge status: Home / Self Care | Payer: 59 | Attending: Urgent Care | Admitting: Urgent Care

## 2023-01-30 DIAGNOSIS — L0291 Cutaneous abscess, unspecified: Secondary | ICD-10-CM | POA: Diagnosis not present

## 2023-01-30 DIAGNOSIS — M79622 Pain in left upper arm: Secondary | ICD-10-CM | POA: Diagnosis not present

## 2023-01-30 DIAGNOSIS — L02412 Cutaneous abscess of left axilla: Secondary | ICD-10-CM | POA: Diagnosis not present

## 2023-01-30 MED ORDER — DOXYCYCLINE HYCLATE 100 MG PO CAPS: 100.0000 mg | ORAL_CAPSULE | Freq: Two times a day (BID) | ORAL | 0 refills | Status: AC

## 2023-01-30 MED ORDER — IBUPROFEN 600 MG PO TABS: 600.0000 mg | ORAL_TABLET | Freq: Four times a day (QID) | ORAL | 0 refills | Status: AC | PRN

## 2023-01-30 NOTE — Discharge Instructions (Signed)
Please change your dressing 2-5 times daily. Do not apply any ointments or creams. Each time you change your dressing, make sure that you are pressing on the wound to get pus to come out.  Try your best to clean the wound with Dial antibacterial soap or Dove for gentle skin and warm water. Pat your wound dry and let it air out if possible to make sure it is dry before reapplying another dressing.   Start doxycycline for the infection/abscess. Use ibuprofen for the pain.

## 2023-01-30 NOTE — ED Provider Notes (Signed)
[facility]  Note:  This document was prepared using Conservation officer, historic buildings and may include unintentional dictation errors.  MRN: [id number] DOB: 1970-11-23  Subjective:   Megan Wall is a 52 y.o. female presenting for 5-day history of 2 separate abscesses over the left axilla.  Areas have been very tender and raw.  No history of hidradenitis or abscesses to the area or in general.  No chronic medications.  Allergies  Allergen Reactions  . Other     Black berries, blue berries, kiwi, "anything with blue"  . Tramadol Itching    Past Medical History:  Diagnosis Date  . Anemia   . Anxiety    no meds  . Depression    no meds  . SVD (spontaneous vaginal delivery)    x 4     Past Surgical History:  Procedure Laterality Date  . APPENDECTOMY    . goiter surgery    . LAPAROSCOPIC APPENDECTOMY N/A 12/21/2012   Procedure: APPENDECTOMY LAPAROSCOPIC;  Surgeon: Kandis Cocking, MD;  Location: [facility];  Service: General;  Laterality: N/A;  . LAPAROSCOPIC ASSISTED VAGINAL HYSTERECTOMY Bilateral 05/12/2013   Procedure: LAPAROSCOPIC ASSISTED VAGINAL HYSTERECTOMY WITH BILATERAL SALPINGECTOMY;  Surgeon: Willodean Rosenthal, MD;  Location: [facility];  Service: Gynecology;  Laterality: Bilateral;    Family History  Problem Relation Age of Onset  . Diabetes Mother   . Diabetes Other   . Cancer Other     Social History   Tobacco Use  . Smoking status: Former    Current packs/day: 0.15    Types: Cigarettes  . Smokeless tobacco: Never  Vaping Use  . Vaping status: Never Used  Substance Use Topics  . Alcohol use: Not Currently  . Drug use: No    ROS   Objective:   Vitals: BP 117/84 (BP Location: Right Arm)   Pulse 98   Temp 98.4 F (36.9 C) (Oral)   Resp 16   LMP 04/26/2013   SpO2 97%   Physical Exam Constitutional:      General: She is not in acute distress.    Appearance: Normal appearance. She is well-developed. She is not  ill-appearing, toxic-appearing or diaphoretic.  HENT:     Head: Normocephalic and atraumatic.     Nose: Nose normal.     Mouth/Throat:     Mouth: Mucous membranes are moist.  Eyes:     General: No scleral icterus.       Right eye: No discharge.        Left eye: No discharge.     Extraocular Movements: Extraocular movements intact.  Cardiovascular:     Rate and Rhythm: Normal rate.  Pulmonary:     Effort: Pulmonary effort is normal.  Skin:    General: Skin is warm and dry.       Neurological:     General: No focal deficit present.     Mental Status: She is alert and oriented to person, place, and time.  Psychiatric:        Mood and Affect: Mood normal.        Behavior: Behavior normal.   PROCEDURE NOTE: I&D of Abscess Verbal consent obtained. Local anesthesia with 8cc of 2% lidocaine with epinephrine. Site cleansed with betadine, alcohol, soap and water. Incision of 1/4cm was made to each site using an 11 blade, 10cc cumulatively expressed consisting of a mixture of pus and serosanguinous fluid. Wound cavity was explored with curved hemostats and loculations loosened. Cleansed  and dressed.  Assessment and Plan :   PDMP not reviewed this encounter.  1. Left axillary pain   2. Abscess of left axilla   3. Abscess of multiple sites    Successful I&D performed.  Wound care reviewed.  Start doxycycline for the abscess, ibuprofen for pain and inflammation. Counseled patient on potential for adverse effects with medications prescribed/recommended today, ER and return-to-clinic precautions discussed, patient verbalized understanding.    Wallis Bamberg, [place] 01/30/23 1335

## 2023-01-30 NOTE — ED Triage Notes (Signed)
Pt c/o left axilla abscesses x 2 x 5 days-NAD-steady gait

## 2023-04-07 DIAGNOSIS — S50862A Insect bite (nonvenomous) of left forearm, initial encounter: Secondary | ICD-10-CM | POA: Diagnosis not present

## 2023-04-07 DIAGNOSIS — W57XXXA Bitten or stung by nonvenomous insect and other nonvenomous arthropods, initial encounter: Secondary | ICD-10-CM | POA: Diagnosis not present

## 2023-04-07 DIAGNOSIS — S40862A Insect bite (nonvenomous) of left upper arm, initial encounter: Secondary | ICD-10-CM | POA: Diagnosis not present
